# Patient Record
Sex: Female | Born: 1980 | Race: White | Hispanic: No | Marital: Married | Smoking: Never smoker
Health system: Southern US, Community
[De-identification: ages and names within clinical notes are randomized; demographics above are authoritative.]

## PROBLEM LIST (undated history)

## (undated) DIAGNOSIS — F419 Anxiety disorder, unspecified: Secondary | ICD-10-CM

## (undated) DIAGNOSIS — C4490 Unspecified malignant neoplasm of skin, unspecified: Secondary | ICD-10-CM

## (undated) DIAGNOSIS — C439 Malignant melanoma of skin, unspecified: Secondary | ICD-10-CM

## (undated) HISTORY — PX: COSMETIC SURGERY: SHX468

## (undated) HISTORY — PX: BREAST ENHANCEMENT SURGERY: SHX7

## (undated) HISTORY — DX: Unspecified malignant neoplasm of skin, unspecified: C44.90

## (undated) HISTORY — DX: Anxiety disorder, unspecified: F41.9

## (undated) HISTORY — DX: Malignant melanoma of skin, unspecified: C43.9

---
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
## 2001-10-14 ENCOUNTER — Other Ambulatory Visit: Admission: RE | Admit: 2001-10-14 | Discharge: 2001-10-14 | Payer: Self-pay | Admitting: Family Medicine

## 2002-12-03 ENCOUNTER — Other Ambulatory Visit: Admission: RE | Admit: 2002-12-03 | Discharge: 2002-12-03 | Payer: Self-pay | Admitting: Family Medicine

## 2003-11-27 ENCOUNTER — Other Ambulatory Visit: Admission: RE | Admit: 2003-11-27 | Discharge: 2003-11-27 | Payer: Self-pay | Admitting: Family Medicine

## 2004-12-28 ENCOUNTER — Observation Stay (HOSPITAL_COMMUNITY): Admission: AD | Admit: 2004-12-28 | Discharge: 2004-12-28 | Payer: Self-pay | Admitting: Obstetrics & Gynecology

## 2004-12-30 ENCOUNTER — Inpatient Hospital Stay (HOSPITAL_COMMUNITY): Admission: AD | Admit: 2004-12-30 | Discharge: 2005-01-02 | Payer: Self-pay | Admitting: Obstetrics and Gynecology

## 2005-02-20 ENCOUNTER — Other Ambulatory Visit: Admission: RE | Admit: 2005-02-20 | Discharge: 2005-02-20 | Payer: Self-pay | Admitting: Obstetrics and Gynecology

## 2005-12-08 ENCOUNTER — Encounter: Payer: Self-pay | Admitting: Family Medicine

## 2005-12-08 ENCOUNTER — Other Ambulatory Visit: Admission: RE | Admit: 2005-12-08 | Discharge: 2005-12-08 | Payer: Self-pay | Admitting: Family Medicine

## 2005-12-08 ENCOUNTER — Ambulatory Visit: Payer: Self-pay | Admitting: Family Medicine

## 2005-12-08 LAB — CONVERTED CEMR LAB: Pap Smear: NORMAL

## 2007-01-22 ENCOUNTER — Telehealth (INDEPENDENT_AMBULATORY_CARE_PROVIDER_SITE_OTHER): Payer: Self-pay | Admitting: *Deleted

## 2007-02-21 ENCOUNTER — Telehealth (INDEPENDENT_AMBULATORY_CARE_PROVIDER_SITE_OTHER): Payer: Self-pay | Admitting: *Deleted

## 2007-02-27 ENCOUNTER — Encounter: Payer: Self-pay | Admitting: Family Medicine

## 2007-02-27 DIAGNOSIS — N946 Dysmenorrhea, unspecified: Secondary | ICD-10-CM

## 2007-03-22 ENCOUNTER — Encounter: Payer: Self-pay | Admitting: Family Medicine

## 2007-03-22 ENCOUNTER — Other Ambulatory Visit: Admission: RE | Admit: 2007-03-22 | Discharge: 2007-03-22 | Payer: Self-pay | Admitting: Family Medicine

## 2007-03-22 ENCOUNTER — Ambulatory Visit: Payer: Self-pay | Admitting: Family Medicine

## 2007-03-27 ENCOUNTER — Encounter (INDEPENDENT_AMBULATORY_CARE_PROVIDER_SITE_OTHER): Payer: Self-pay | Admitting: *Deleted

## 2007-03-27 LAB — CONVERTED CEMR LAB: Pap Smear: NORMAL

## 2007-04-04 ENCOUNTER — Telehealth (INDEPENDENT_AMBULATORY_CARE_PROVIDER_SITE_OTHER): Payer: Self-pay | Admitting: *Deleted

## 2007-04-17 ENCOUNTER — Telehealth (INDEPENDENT_AMBULATORY_CARE_PROVIDER_SITE_OTHER): Payer: Self-pay | Admitting: *Deleted

## 2007-08-01 DIAGNOSIS — C439 Malignant melanoma of skin, unspecified: Secondary | ICD-10-CM

## 2007-08-01 HISTORY — DX: Malignant melanoma of skin, unspecified: C43.9

## 2007-08-02 ENCOUNTER — Ambulatory Visit: Payer: Self-pay | Admitting: Family Medicine

## 2007-08-02 LAB — CONVERTED CEMR LAB
Beta hcg, urine, semiquantitative: NEGATIVE
Glucose, Urine, Semiquant: NEGATIVE
Ketones, urine, test strip: NEGATIVE
Urine crystals, microscopic: 0 /hpf
Urobilinogen, UA: 0.2
WBC Urine, dipstick: NEGATIVE
pH: 7

## 2007-08-03 ENCOUNTER — Encounter: Payer: Self-pay | Admitting: Family Medicine

## 2007-08-05 LAB — CONVERTED CEMR LAB
Basophils Relative: 0.1 % (ref 0.0–1.0)
Eosinophils Relative: 2.6 % (ref 0.0–5.0)
Lymphocytes Relative: 28.8 % (ref 12.0–46.0)
Neutro Abs: 4.7 10*3/uL (ref 1.4–7.7)
Platelets: 325 10*3/uL (ref 150–400)
WBC: 7.9 10*3/uL (ref 4.5–10.5)

## 2007-11-12 ENCOUNTER — Ambulatory Visit: Payer: Self-pay | Admitting: Family Medicine

## 2007-11-12 LAB — CONVERTED CEMR LAB: Rapid Strep: NEGATIVE

## 2008-02-13 ENCOUNTER — Encounter: Payer: Self-pay | Admitting: Family Medicine

## 2008-02-19 ENCOUNTER — Encounter: Payer: Self-pay | Admitting: Family Medicine

## 2008-02-28 ENCOUNTER — Encounter: Payer: Self-pay | Admitting: Family Medicine

## 2008-03-23 ENCOUNTER — Encounter: Payer: Self-pay | Admitting: Family Medicine

## 2008-04-02 ENCOUNTER — Inpatient Hospital Stay (HOSPITAL_COMMUNITY): Admission: AD | Admit: 2008-04-02 | Discharge: 2008-04-02 | Payer: Self-pay | Admitting: Obstetrics and Gynecology

## 2008-04-06 ENCOUNTER — Inpatient Hospital Stay (HOSPITAL_COMMUNITY): Admission: AD | Admit: 2008-04-06 | Discharge: 2008-04-06 | Payer: Self-pay | Admitting: Obstetrics and Gynecology

## 2008-04-08 ENCOUNTER — Inpatient Hospital Stay (HOSPITAL_COMMUNITY): Admission: AD | Admit: 2008-04-08 | Discharge: 2008-04-09 | Payer: Self-pay | Admitting: Obstetrics and Gynecology

## 2008-04-08 ENCOUNTER — Encounter (INDEPENDENT_AMBULATORY_CARE_PROVIDER_SITE_OTHER): Payer: Self-pay | Admitting: Obstetrics and Gynecology

## 2008-06-01 ENCOUNTER — Telehealth (INDEPENDENT_AMBULATORY_CARE_PROVIDER_SITE_OTHER): Payer: Self-pay | Admitting: *Deleted

## 2008-06-02 ENCOUNTER — Ambulatory Visit: Payer: Self-pay | Admitting: Family Medicine

## 2008-06-15 DIAGNOSIS — D239 Other benign neoplasm of skin, unspecified: Secondary | ICD-10-CM

## 2008-06-15 HISTORY — DX: Other benign neoplasm of skin, unspecified: D23.9

## 2008-08-28 ENCOUNTER — Encounter: Payer: Self-pay | Admitting: Family Medicine

## 2009-02-19 ENCOUNTER — Encounter: Payer: Self-pay | Admitting: Family Medicine

## 2009-07-19 ENCOUNTER — Telehealth: Payer: Self-pay | Admitting: Family Medicine

## 2009-08-27 ENCOUNTER — Encounter: Payer: Self-pay | Admitting: Family Medicine

## 2010-01-20 ENCOUNTER — Ambulatory Visit: Payer: Self-pay | Admitting: Family Medicine

## 2010-01-21 LAB — CONVERTED CEMR LAB
AST: 18 units/L (ref 0–37)
Alkaline Phosphatase: 40 units/L (ref 39–117)
BUN: 11 mg/dL (ref 6–23)
Basophils Absolute: 0 10*3/uL (ref 0.0–0.1)
Calcium: 9.2 mg/dL (ref 8.4–10.5)
Cholesterol: 187 mg/dL (ref 0–200)
Creatinine, Ser: 0.7 mg/dL (ref 0.4–1.2)
GFR calc non Af Amer: 109.28 mL/min (ref >60)
Glucose, Bld: 76 mg/dL (ref 70–99)
HDL: 42.5 mg/dL (ref >39.00)
Lymphocytes Relative: 28.3 % (ref 12.0–46.0)
Monocytes Relative: 7.6 % (ref 3.0–12.0)
Neutrophils Relative %: 59.5 % (ref 43.0–77.0)
Platelets: 252 10*3/uL (ref 150.0–400.0)
RDW: 13.2 % (ref 11.5–14.6)
Sodium: 140 meq/L (ref 135–145)
Total Bilirubin: 0.5 mg/dL (ref 0.3–1.2)
VLDL: 27 mg/dL (ref 0.0–40.0)

## 2010-01-24 ENCOUNTER — Other Ambulatory Visit: Admission: RE | Admit: 2010-01-24 | Discharge: 2010-01-24 | Payer: Self-pay | Admitting: Family Medicine

## 2010-01-24 ENCOUNTER — Ambulatory Visit: Payer: Self-pay | Admitting: Family Medicine

## 2010-02-25 ENCOUNTER — Encounter: Payer: Self-pay | Admitting: Family Medicine

## 2010-04-27 ENCOUNTER — Ambulatory Visit: Payer: Self-pay | Admitting: Family Medicine

## 2010-04-27 DIAGNOSIS — G479 Sleep disorder, unspecified: Secondary | ICD-10-CM | POA: Insufficient documentation

## 2010-07-26 ENCOUNTER — Ambulatory Visit
Admission: RE | Admit: 2010-07-26 | Discharge: 2010-07-26 | Payer: Self-pay | Source: Home / Self Care | Attending: Family Medicine | Admitting: Family Medicine

## 2010-08-09 ENCOUNTER — Ambulatory Visit
Admission: RE | Admit: 2010-08-09 | Discharge: 2010-08-09 | Payer: Self-pay | Source: Home / Self Care | Attending: Family Medicine | Admitting: Family Medicine

## 2010-08-09 ENCOUNTER — Encounter: Payer: Self-pay | Admitting: Family Medicine

## 2010-09-01 NOTE — Letter (Signed)
Summary: [facility] Surgical Oncology & Endocrine  [facility] Surgical Oncology & Endocrine   Imported By: Sherian Rein 04/07/2010 10:00:07  _____________________________________________________________________  External Attachment:    Type:   Image     Comment:   External Document

## 2010-09-01 NOTE — Letter (Signed)
Summary: [facility] Surgical Oncology & Endocrine Surgery  [facility] Endocrine Surgery   Imported By: Lanelle Bal 10/11/2009 11:18:24  _____________________________________________________________________  External Attachment:    Type:   Image     Comment:   External Document

## 2010-09-01 NOTE — Assessment & Plan Note (Signed)
Summary: COUGH/COLD/DLO   Vital Signs:  Patient profile:   30 year old female Height:      64 inches Weight:      134.25 pounds BMI:     23.13 Temp:     98.1 degrees F oral Pulse rate:   80 / minute Pulse rhythm:   regular BP sitting:   106 / 72  (left arm) Cuff size:   regular  Vitals Entered By: Lewanda Rife LPN (July 26, 2010 9:03 AM) CC: cold, productive cugh with green mucus, h/a   History of Present Illness: 2 weeks of uri and not getting better  took robitussin cold and sinus- not helping  coughing stuff up that is green  some days a little better - worse at night  a little facial pressure  nose is congested  ears do not hurt throat sore from coughing   son has a raspy cough   Allergies: 1)  ! 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 Eyes:  Denies blurring, discharge, and eye irritation. CV:  Denies chest pain or discomfort, palpitations, and shortness of breath with exertion. Resp:  Complains of cough and sputum productive; denies pleuritic, shortness of breath, and wheezing. GI:  Denies nausea and vomiting. Derm:  Denies rash.  Physical Exam  General:  Well-developed,well-nourished,in no acute distress; alert,appropriate and cooperative throughout examination Head:  normocephalic, atraumatic, and no abnormalities observed.  no sinus tenderness  Eyes:  vision grossly intact, pupils equal, pupils round, and pupils reactive to light.   Ears:  R ear normal and L ear normal.   Nose:  nares are  injected and congested bilaterally  Mouth:  pharynx pink and moist.   mild post injection  Neck:  No deformities, masses, or tenderness noted. Chest Wall:  No deformities, masses, or tenderness noted. Lungs:  CTA with generally harsh bs and occ rhonchi no rales no wheeze or sob Heart:  Normal rate and regular rhythm. S1 and S2 normal without gallop, murmur, click, rub or other extra sounds. Skin:  Intact without suspicious lesions or rashes Cervical Nodes:  No lymphadenopathy noted Psych:  normal affect, talkative and pleasant    Impression & Recommendations:  Problem # 1:  BRONCHITIS, ACUTE (ICD-466.0) Assessment New  due to persistant purulent sputum and 2 wk duration will tx with abx- zpak thankfully no reactive airways so far recommend sympt care- see pt instructions   pt advised to update me if symptoms worsen or do not improve  Her updated medication list for this problem includes:    Zithromax Z-pak 250 Mg Tabs (Azithromycin) .Marland Kitchen... Take by mouth as directed  Orders: Prescription Created Electronically [phone])  Complete Medication List: 1)  Iud  .... Placed 11/2008 2)  Ibuprofen 200 Mg Tabs (Ibuprofen) .... Otc as directed. 3)  Zithromax Z-pak 250 Mg Tabs (Azithromycin) .... Take by mouth as directed  Patient Instructions: 1)  take zithromax as directed  2)  you can try mucinex over the counter twice daily as directed and nasal saline spray for congestion 3)  tylenol over the counter as directed may  help with aches, headache and fever 4)  call if symptoms worsen or if not improved in 4-5 days  Prescriptions: ZITHROMAX Z-PAK 250 MG TABS (AZITHROMYCIN) take by mouth as directed  #1 pack x 0   Entered and Authorized by:   Judith Part MD   Signed by:   Judith Part MD on 07/26/2010   Method used:   Electronically to        [id number] (retail)       [address]       [place], [place]  [address]       Ph: [phone]       Fax: [phone]    RxID:   [id number]    Orders Added: 1)  Prescription Created Electronically [[id number]] 2)  Est. Patient Level III [24401]    Current Allergies (reviewed today): ! PENICILLIN

## 2010-09-01 NOTE — Assessment & Plan Note (Signed)
Summary: CPX/DLO   Vital Signs:  Patient profile:   30 year old female Height:      64 inches Weight:      126 pounds BMI:     21.71 Temp:     98.6 degrees F oral Pulse rate:   80 / minute Pulse rhythm:   regular BP sitting:   112 / 72  (left arm) Cuff size:   regular  Vitals Entered By: Lewanda Rife LPN (January 24, 2010 9:39 AM) CC: CPX with pap and breast exam LMP 12/06/09 (Has IUD and does not have monthly periods)   History of Present Illness: here for health mt and gyn exam   is doing ok overall - physically  her separation has taken a big toll on her  has not seen a counselor -- good support in friends and family  has a lot of help  divorce is upcoming in aug   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 Eyes:  Denies blurring and eye  irritation. CV:  Denies chest pain or discomfort, lightheadness, palpitations, and shortness of breath with exertion. Resp:  Denies cough, shortness of breath, and wheezing. GI:  Denies abdominal pain, change in bowel habits, indigestion, and nausea. GU:  Denies discharge, dysuria, and urinary frequency. MS:  Denies joint pain, joint swelling, and cramps. Derm:  Denies itching, lesion(s), poor wound healing, and rash. Neuro:  Denies numbness and tingling. Psych:  mood is fair - stress of divorce. Endo:  Denies cold intolerance, excessive thirst, excessive urination, and heat intolerance. Heme:  Denies abnormal bruising and bleeding.  Physical Exam  General:  Well-developed,well-nourished,in no acute distress; alert,appropriate and cooperative throughout examination Head:  normocephalic, atraumatic, and no abnormalities observed.   Eyes:  vision grossly intact, pupils equal, pupils round, and pupils reactive to light.   Ears:  R ear normal and L ear normal.   Nose:  no nasal discharge.   Mouth:  pharynx pink and moist.   Neck:  supple with full rom and no masses or thyromegally, no JVD or carotid bruit  Chest Wall:  No deformities, masses, or tenderness noted. Breasts:  No mass, nodules, thickening, tenderness, bulging, retraction,  inflamation, nipple discharge or skin changes noted.   breast implants noted  Lungs:  Normal respiratory effort, chest expands symmetrically. Lungs are clear to auscultation, no crackles or wheezes. Heart:  Normal rate and regular rhythm. S1 and S2 normal without gallop, murmur, click, rub or other extra sounds. Abdomen:  Bowel sounds positive,abdomen soft and non-tender without masses, organomegaly or hernias noted. Genitalia:  Normal introitus for age, no external lesions, no vaginal discharge, mucosa pink and moist, no vaginal or cervical lesions, no vaginal atrophy, no friaility or hemorrhage, normal uterus size and position, no adnexal masses or  tenderness iud string noted at os Msk:  No deformity or scoliosis noted of thoracic or lumbar spine.  no acute joint changes  Pulses:  R and L carotid,radial,femoral,dorsalis pedis and posterior tibial pulses are full and equal bilaterally Extremities:  No clubbing, cyanosis, edema, or deformity noted with normal full range of motion of all joints.   Neurologic:  sensation intact to light touch, gait normal, and DTRs symmetrical and normal.   Skin:  Intact without suspicious lesions or rashes Cervical Nodes:  No lymphadenopathy noted Axillary Nodes:  No palpable lymphadenopathy Inguinal Nodes:  No significant adenopathy Psych:  normal affect, talkative and pleasant    Impression & Recommendations:  Problem # 1:  HEALTH MAINTENANCE EXAM (ICD-V70.0) Assessment Comment Only reviewed health habits including diet, exercise and skin cancer prevention reviewed health maintenance list and family history  urged to keep up with regular derm visits and use her sun protection reviewed wellness labs in detail incl good chol profile  Problem # 2:  GYNECOLOGICAL EXAMINATION, ROUTINE (ICD-V72.31) Assessment: Comment Only annual exam with pap  doing well with iud- no menses  reminded to use condoms for std prev -- screen today  Problem # 3:  DYSMENORRHEA (ICD-625.3) Assessment: Improved resolved with IUD- happy with that   Problem # 4:  SCREENING EXAMINATION FOR VENEREAL DISEASE (ICD-V74.5) Assessment: New std screen - gon / chlam screen today at pt request   Complete Medication List: 1)  Iud  .... Placed 11/2008 2)  Ibuprofen 200 Mg Tabs (Ibuprofen) .... Otc as directed.  Other Orders: TD Toxoids IM 7 YR + (16109) Admin 1st Vaccine (60454)  Patient Instructions: 1)  tetnus shot today  2)  will update you with pap and test results when they return 3)  keep up the good health habits   Current Allergies (reviewed today): ! PENICILLIN    Immunizations Administered:  Tetanus  Vaccine:    Vaccine Type: Td    Site: right deltoid    Mfr: Sanofi Pasteur    Dose: 0.5 ml    Route: IM    Given by: Lewanda Rife LPN    Exp. Date: 08/26/2011    Lot #: [id number]    VIS given: 06/18/07 version given January 24, 2010.

## 2010-09-01 NOTE — Assessment & Plan Note (Signed)
Summary: PROBLEMS SLEEPING   Vital Signs:  Patient profile:   30 year old female Height:      64 inches Weight:      127.75 pounds BMI:     22.01 Temp:     98.4 degrees F oral Pulse rate:   80 / minute Pulse rhythm:   regular BP sitting:   110 / 74  (left arm) Cuff size:   regular  Vitals Entered By: Lewanda Rife LPN (April 27, 2010 8:53 AM) CC: problems sleeping due to numbness in different areas of body   History of Present Illness: trouble sleeping for the past few months  wakes up because she gets numb in different parts of body hands -- if she sleeps in certain position  sometimes feet -- if she sits on her legs  has trouble when she carries a heavy bag   thinks it is anxiety related  is going through divorce  ex husb wants some custody job- budget time very stressful   has seen a counselor a few times -- has been helpful  sees Toribio Harbour   is feeling anxious mainly at night - and restless   has not tried otc meds     Allergies: 1)  ! 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 Eyes:  Denies blurring and eye irritation. CV:  Denies lightheadness and palpitations. Resp:  Denies cough, shortness of breath, and wheezing. GI:  Denies abdominal pain, change in bowel habits, and indigestion. GU:  Denies dysuria and urinary frequency. MS:  Denies joint pain and stiffness. Derm:  Denies itching, lesion(s), poor wound healing, and rash. Neuro:  Complains of numbness and tingling; denies visual  disturbances. Psych:  Complains of anxiety; denies panic attacks, sense of great danger, and suicidal thoughts/plans. Endo:  Denies cold intolerance, excessive thirst, excessive urination, and heat intolerance. Heme:  Denies abnormal bruising and bleeding.  Physical Exam  General:  Well-developed,well-nourished,in no acute distress; alert,appropriate and cooperative throughout examination Head:  normocephalic, atraumatic, and no abnormalities observed.   Eyes:  vision grossly intact, pupils equal, pupils round, and pupils reactive to light.  no nystagmus Mouth:  pharynx pink and moist.   Neck:  No deformities, masses, or tenderness noted. Lungs:  Normal respiratory effort, chest expands symmetrically. Lungs are clear to auscultation, no crackles or wheezes. Heart:  Normal rate and regular rhythm. S1 and S2 normal without gallop, murmur, click, rub or other extra sounds. Abdomen:  Bowel sounds positive,abdomen soft and non-tender without masses, organomegaly or hernias noted. Msk:  No deformity or scoliosis noted of thoracic or lumbar spine.  no acute joint changes  Extremities:  No clubbing, cyanosis, edema, or deformity noted with normal full range of motion of all joints.   Neurologic:  alert & oriented X3, cranial nerves II-XII intact, strength normal in all extremities, sensation intact to light touch, gait normal, DTRs symmetrical and normal, toes down bilaterally on Babinski, and Romberg negative.   Skin:  Intact without suspicious lesions or rashes Cervical Nodes:  No lymphadenopathy noted Psych:  seems stressed but overall chipper  with good eye contact  not tearful   Impression & Recommendations:  Problem # 1:  SLEEP DISORDER (ICD-780.50) Assessment New likley 2nd to anx from recent stressors (disc in detail)  rev sleep hygiene and handout given from [facility]  trial of benadryl or melatonin or volarian if not helpful disc px for anx or sleep   Problem # 2:  STRESS REACTION,  ACUTE, WITH EMOTIONAL DISTURBANCE (ICD-308.0) Assessment: New suspect this is adding to intermittent parethesias and worry disc stressors/ coping tech/ symptoms and opt for tx in detail  adv to continue counseling will updtae me if she feels she needs counseling  Complete Medication List: 1)  Iud  .... Placed 11/2008 2)  Ibuprofen 200 Mg Tabs (Ibuprofen) .... Otc as directed.  Patient Instructions: 1)  for sleep I recommend - bedtime and wake time the same every day  2)  no caffiene after 9-10 am  3)  no alcohol at night  4)  continue exercise at lunch time  5)  don't do anything in bed but sleep  6)  cool room / very dark room  7)  try first 25-50 mg of benadryl about 1 hour before bed  8)  if that does not work- try melatonin or volarian (both herbs )- as directed 9)  if these don't work - let me know  10)  if you feel you need something for anxiety- call  11)  continue counseling   Current Allergies (reviewed today): ! PENICILLIN

## 2010-09-01 NOTE — Assessment & Plan Note (Signed)
Summary: CPX/JRR   Vital Signs:  Patient profile:   30 year old female Height:      64 inches Weight:      136.75 pounds BMI:     23.56 Temp:     98 degrees F oral Pulse rate:   76 / minute Pulse rhythm:   regular BP sitting:   100 / 64  (left arm) Cuff size:   regular  Vitals Entered By: Lewanda Rife LPN (August 09, 2010 9:49 AM) CC: wants pap smear LMP no period with IUD   History of Present Illness: here for pap for exposure to hpv   her husband and she got back together  he and she have had other partners  he had a lesion -- was hpv - the bad subtype-- had it removed  also wants gon / chlamydia test as well   wants to be screened for all stds   is doing well overall  he is treating her very well    no d/c or pain or problems there is one spot to look at that is new   no bleeding on the iud     had pap in june normal   Td 6/11  wt is up 2 lb   good bp 100/64  hx of melanoma - having regular follow ups and doing well with that   Allergies: 1)  ! 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 Eyes:  Denies blurring and eye irritation. CV:  Denies chest pain or discomfort, lightheadness, palpitations, and shortness of breath with exertion. Resp:  Denies cough and wheezing. GI:  Denies abdominal pain, indigestion, and nausea. GU:  Denies abnormal vaginal bleeding, discharge, and  dysuria. MS:  Denies cramps and stiffness. Derm:  Denies itching, lesion(s), poor wound healing, and rash. Neuro:  Denies headaches, numbness, and tingling. Psych:  mood is good . Endo:  Denies cold intolerance, excessive urination, and heat intolerance. Heme:  Denies abnormal bruising and bleeding.  Physical Exam  General:  Well-developed,well-nourished,in no acute distress; alert,appropriate and cooperative throughout examination Head:  normocephalic, atraumatic, and no abnormalities observed.   Eyes:  vision grossly intact, pupils equal, pupils round, and pupils reactive to light.  no conjunctival pallor, injection or icterus  Mouth:  pharynx pink and moist.   Neck:  supple with full rom and no masses or thyromegally, no JVD or carotid bruit  Chest Wall:  No deformities, masses, or tenderness noted. Lungs:  CTA with generally harsh bs and occ rhonchi no rales no wheeze or sob Heart:  Normal rate and regular rhythm. S1 and S2 normal without gallop, murmur, click, rub or other extra sounds. Abdomen:  Bowel sounds positive,abdomen soft and non-tender without masses, organomegaly or hernias noted. no suprapubic tenderness or fullness felt  Genitalia:  Normal introitus for age, no external lesions, no vaginal discharge, mucosa pink and moist, no vaginal or cervical lesions, no vaginal atrophy, no friaility or hemorrhage, normal uterus size and position, no  adnexal masses or tenderness  lesion in question was skin tag on L labia majora Msk:  No deformity or scoliosis noted of thoracic or lumbar spine.   Pulses:  R and L carotid,radial,femoral,dorsalis pedis and posterior tibial pulses are full and equal bilaterally Extremities:  No clubbing, cyanosis, edema, or deformity noted with normal full range of motion of all joints.   Neurologic:  alert & oriented X3, cranial nerves II-XII intact, strength normal in all extremities, sensation intact to light touch, gait normal, DTRs symmetrical and  normal, toes down bilaterally on Babinski, and Romberg negative.   Skin:  Intact without suspicious lesions or rashes Cervical Nodes:  No lymphadenopathy noted Psych:  normal affect, talkative and pleasant    Impression & Recommendations:  Problem # 1:  SPECIAL SCREENING EXAMINATION HUMAN PAPILVIRUS (ICD-V73.81) Assessment New  pt was exp to high risk hpv  will do pap and hpv test today no lesions or symptoms will monitor closely   Orders: Specimen Handling (91478) T-HIV Antibody  (Reflex) ([id number]) T-Syphilis Test (RPR) ([id number]) Pap Smear, Thin Prep ( Collection of) ([id number])  Problem # 2:  SCREENING EXAMINATION FOR VENEREAL DISEASE (ICD-V74.5) Assessment: New  will do gon / chlam/ and hiv and rpr today as well as hpv poss exp no symptoms   Orders: Specimen Handling (24401) T-HIV Antibody  (Reflex) ([id number]) T-Syphilis Test (RPR) ([id number]) Pap Smear, Thin Prep ( Collection of) ([id number])  Problem # 3:  SEXUALLY TRANSMITTED DISEASE, EXPOSURE TO (ICD-V01.6) Assessment: New  see above screening test today  Orders: Specimen Handling (43329) T-HIV Antibody  (Reflex) ([id number]) T-Syphilis Test (RPR) [phone]) Pap Smear, Thin Prep ( Collection of) (T5573)  Complete Medication List: 1)  Iud  .... Placed 11/2008 2)  Ibuprofen 200 Mg Tabs (Ibuprofen) .... Otc as directed.  Patient Instructions: 1)  please draw labs for [employer] HIV and RPR -- for V74.5 and also V01.6 thanks 2)  pap done today- included hpv test and gon/ chlamydia    Orders Added: 1)  Specimen Handling [99000] 2)  T-HIV Antibody  (Reflex) [[id number]] 3)  T-Syphilis Test (RPR) [[id number]] 4)  Est. Patient Level III [76160] 5)  Pap Smear, Thin Prep ( Collection of) [Q0091]    Current Allergies (reviewed today): ! PENICILLIN

## 2010-12-16 NOTE — Discharge Summary (Signed)
NAME:  Caitlin Ward, Caitlin Ward NO.:  [id number]   MEDICAL RECORD NO.:  [id number]          PATIENT TYPE:  INP   LOCATION:  9160                          FACILITY:  [facility]   PHYSICIAN:  Juluis Mire, M.D.   DATE OF BIRTH:  [birth year]   DATE OF ADMISSION:  12/28/2004  DATE OF DISCHARGE:  12/28/2004                                 DISCHARGE SUMMARY   ADMISSION DIAGNOSIS:  Intrauterine pregnancy at 37 weeks, possible labor.   DISCHARGE DIAGNOSIS:  False labor.   For complete history and physical, please see written note.   COURSE IN THE HOSPITAL:  The patient was brought in per Dr. Jennette Kettle for  possible early labor.  She was observed throughout the morning with no  cervical change and relatively irregular uterine activity.  Cervix remained  2 cm.  Fifty percent effaced.  The vertex was presenting.  Membranes were  intact.  Fetal heart rate was reactive with fast decelerations.  She had  evidently had a history of proteinuria in the office.  We went ahead and  watched her blood pressures, all of which were in normal range.  We went  ahead and did PIH panels, all of which were within normal limits.  In view  of the lack of progression we discussed options.  Since she is only 37-1/2  weeks, the decision was to go ahead and discharge her home to await further  progression of labor.  We discussed increasing uterine activity, any leaking  or bleeding should be reported.  Also, fetal movement should stay  consistent.  She will be followed in the office on Friday.      JSM/MEDQ  D:  12/28/2004  T:  12/28/2004  Job:  [id number]

## 2011-03-27 ENCOUNTER — Encounter: Payer: Self-pay | Admitting: Family Medicine

## 2011-03-28 ENCOUNTER — Ambulatory Visit (INDEPENDENT_AMBULATORY_CARE_PROVIDER_SITE_OTHER): Payer: 59 | Admitting: Family Medicine

## 2011-03-28 ENCOUNTER — Encounter: Payer: Self-pay | Admitting: Family Medicine

## 2011-03-28 VITALS — BP 106/72 | HR 76 | Temp 98.5°F | Ht 64.0 in | Wt 121.5 lb

## 2011-03-28 DIAGNOSIS — F419 Anxiety disorder, unspecified: Secondary | ICD-10-CM

## 2011-03-28 DIAGNOSIS — F32A Depression, unspecified: Secondary | ICD-10-CM

## 2011-03-28 DIAGNOSIS — F329 Major depressive disorder, single episode, unspecified: Secondary | ICD-10-CM | POA: Insufficient documentation

## 2011-03-28 DIAGNOSIS — F341 Dysthymic disorder: Secondary | ICD-10-CM

## 2011-03-28 MED ORDER — PAROXETINE HCL 10 MG PO TABS: 20.0000 mg | ORAL_TABLET | Freq: Every evening | ORAL | Status: DC

## 2011-03-28 NOTE — Progress Notes (Signed)
Subjective:    Patient ID: Caitlin Ward, female    DOB: April 17, 1981, 30 y.o.   MRN: [id number]  HPI Here for stress induced weight loss issues  When she gets stressed- gets nauseated when eating and has to stop after a few bites  No throwing up  Not trying to loose weight -- and no body image issues at all   Does have a counselor that she sees every week - sometimes helpful - not all the time   Anxiety comes back a day later -- really up and down like a roller coster  No abuse in the picture  Overall things are looking better  Is not in counseling with her husband yet   In her heart - she thinks things will work out  There is fear from both sides - of the unknown  Has 2 kids- they do pick up on it   Spending time with her family gives her joy  Now work no longer gives her joy   Anxious all the time -- feels palpitaions or short of breath until the situation changes Takes ibuprofen every day for headaches -- every day  Also occ gets some vertigo at night time Also not ? Enough fluids   Is sleeping ok - so that is good (takes a while to go to sleep)   Patient Active Problem List  Diagnoses  . DYSMENORRHEA  . SLEEP DISORDER  . Anxiety and depression   Past Medical History  Diagnosis Date  . Malignant melanoma     left arm- wide excision   Past Surgical History  Procedure Date  . Breast enhancement surgery    History  Substance Use Topics  . Smoking status: Never Smoker   . Smokeless tobacco: Not on file  . Alcohol Use: Not on file   Family History  Problem Relation Age of Onset  . Coronary artery disease Maternal Grandfather   . Hypertension Paternal Grandfather    Allergies  Allergen Reactions  . Penicillins     REACTION: As a small child does not remember exact reaction.   Current Outpatient Prescriptions on File Prior to Visit  Medication Sig Dispense Refill  . ibuprofen (ADVIL,MOTRIN) 200 MG tablet Take otc as directed       . IUD'S IU by  Intrauterine route.             Review of Systems Review of Systems  Constitutional: Negative for fever, appetite change, fatigue and unexpected weight change.  Eyes: Negative for pain and visual disturbance.  Respiratory: Negative for cough and shortness of breath.   Cardiovascular: Negative.  for cp and palpitations  Gastrointestinal: Negative for nausea, diarrhea and constipation.  Genitourinary: Negative for urgency and frequency.  Skin: Negative for pallor. no rash  Neurological: Negative for weakness, light-headedness, numbness and headaches.  Hematological: Negative for adenopathy. Does not bruise/bleed easily.  Psychiatric/Behavioral: pos for depressive and anxious symptoms , no SI          Objective:   Physical Exam  Constitutional: She appears well-developed and well-nourished. No distress.       Slim and fatigued appearing   HENT:  Head: Normocephalic and atraumatic.  Eyes: Conjunctivae and EOM are normal. Pupils are equal, round, and reactive to light. No scleral icterus.  Neck: Normal range of motion. Neck supple. No JVD present. No thyromegaly present.  Cardiovascular: Normal rate, regular rhythm, normal heart sounds and intact distal pulses.   No murmur heard. Pulmonary/Chest: Effort normal and  breath sounds normal. No respiratory distress. She has no wheezes.  Abdominal: Soft. Bowel sounds are normal. She exhibits no distension and no mass. There is no tenderness.  Musculoskeletal: She exhibits no edema.  Lymphadenopathy:    She has no cervical adenopathy.  Neurological: She is alert. She has normal reflexes. She exhibits normal muscle tone. Coordination normal.       No tremor   Skin: Skin is warm and dry. No rash noted. No erythema. No pallor.  Psychiatric:       Generally anxious  Tearful at times Good communication skills and talks freely of stressors  Fair eye contact           Assessment & Plan:

## 2011-03-28 NOTE — Patient Instructions (Signed)
Continue counseling  Start paxil 10 mg one pill daily in the evening - for 2 weeks and then if well tolerated increase to 2 pills once daily in evening  If depression or anxiety worsens - stop the drug and update me  If weight loss continues also update me  Follow up with me in 3-4 weeks

## 2011-03-28 NOTE — Assessment & Plan Note (Signed)
Related to stress from marital problems- causing GI symptoms and weight loss  Disc coping skills Will continue counseling  Start paxil 10 mg for 2 wk then inc to 20mg  as tolerated Disc poss side eff- if worse orSI will call and stop med F/u 3-4 wk  Will do labs if further wt loss >25 min spent with face to face with patient, >50% counseling and/or coordinating care

## 2011-05-01 ENCOUNTER — Encounter: Payer: Self-pay | Admitting: Family Medicine

## 2011-05-01 ENCOUNTER — Ambulatory Visit: Payer: 59 | Admitting: Family Medicine

## 2011-05-01 ENCOUNTER — Ambulatory Visit (INDEPENDENT_AMBULATORY_CARE_PROVIDER_SITE_OTHER): Payer: 59 | Admitting: Family Medicine

## 2011-05-01 VITALS — BP 100/68 | HR 76 | Temp 97.9°F | Ht 64.0 in | Wt 129.8 lb

## 2011-05-01 DIAGNOSIS — F341 Dysthymic disorder: Secondary | ICD-10-CM

## 2011-05-01 DIAGNOSIS — F419 Anxiety disorder, unspecified: Secondary | ICD-10-CM

## 2011-05-01 DIAGNOSIS — Z23 Encounter for immunization: Secondary | ICD-10-CM

## 2011-05-01 NOTE — Assessment & Plan Note (Signed)
Stress related and much imp with imp in stress  paxil was sedating but helped appetite much and gained 8 lb- feels much better  Off of it after 2 weeks Few small panic attacks and now feels back to normal  Will update me if symptoms return Overall good coping skills  Flu shot also today

## 2011-05-01 NOTE — Progress Notes (Signed)
  Subjective:    Patient ID: Caitlin Ward, female    DOB: 13-Oct-1980, 30 y.o.   MRN: [id number]  HPI Here to f/u for dep/anx/ stress related issues   paxil px last visit Took it for 2 weeks Unsure if it helps Sleepy all the time and was eating like a pig  Stress is better -- husband / kid's father came back Wt is up 8 lb   Had a lot of stress in work for a while  Several episodes of nervousness with rapid heart beat - now is much better  After this week work will get a lot better - budget is due   Wt is up 8 lb  Patient Active Problem List  Diagnoses  . DYSMENORRHEA  . SLEEP DISORDER  . Anxiety and depression   Past Medical History  Diagnosis Date  . Malignant melanoma     left arm- wide excision   Past Surgical History  Procedure Date  . Breast enhancement surgery    History  Substance Use Topics  . Smoking status: Never Smoker   . Smokeless tobacco: Not on file  . Alcohol Use: Not on file   Family History  Problem Relation Age of Onset  . Coronary artery disease Maternal Grandfather   . Hypertension Paternal Grandfather    Allergies  Allergen Reactions  . Penicillins     REACTION: As a small child does not remember exact reaction.   Current Outpatient Prescriptions on File Prior to Visit  Medication Sig Dispense Refill  . ibuprofen (ADVIL,MOTRIN) 200 MG tablet Take otc as directed       . IUD'S IU by Intrauterine route.            Review of Systems Review of Systems  Constitutional: Negative for fever, appetite change, fatigue and wt loss , pos for wt gain .  Eyes: Negative for pain and visual disturbance.  Respiratory: Negative for cough and shortness of breath.   Cardiovascular: Negative for cp or palpitations    Gastrointestinal: Negative for nausea, diarrhea and constipation.  Genitourinary: Negative for urgency and frequency.  Skin: Negative for pallor or rash   Neurological: Negative for weakness, light-headedness, numbness and headaches.   Hematological: Negative for adenopathy. Does not bruise/bleed easily.  Psychiatric/Behavioral: Negative for dysphoric mood. Anxiety is improved          Objective:   Physical Exam  Constitutional: She appears well-developed and well-nourished. No distress.  HENT:  Head: Normocephalic and atraumatic.  Mouth/Throat: Oropharynx is clear and moist.  Eyes: Conjunctivae and EOM are normal. Pupils are equal, round, and reactive to light.  Neck: Normal range of motion. Neck supple. No JVD present. Carotid bruit is not present. No thyromegaly present.  Cardiovascular: Normal rate, regular rhythm and normal heart sounds.   Pulmonary/Chest: Effort normal and breath sounds normal. No respiratory distress. She has no wheezes.  Lymphadenopathy:    She has no cervical adenopathy.  Neurological: She is alert. She has normal reflexes.       No tremor   Skin: Skin is warm and dry. No pallor.  Psychiatric: She has a normal mood and affect.       Much improved affect - is more relaxed and smiling           Assessment & Plan:

## 2011-05-01 NOTE — Patient Instructions (Signed)
I'm glad you are doing better and able to get off the paxil  Keep up the good diet and coping skills  Flu shot today -- good!  Let me know if you start to feel bad or loose weight again

## 2011-05-03 LAB — CBC
HCT: 38.8
Hemoglobin: 11 — ABNORMAL LOW
MCV: 90.4
Platelets: 288
Platelets: 295
RBC: 3.53 — ABNORMAL LOW
RBC: 4.29
RDW: 13.9
WBC: 12.3 — ABNORMAL HIGH
WBC: 9.1

## 2011-05-03 LAB — URIC ACID: Uric Acid, Serum: 4.6

## 2011-05-03 LAB — COMPREHENSIVE METABOLIC PANEL
BUN: 6
CO2: 24
Chloride: 106
Creatinine, Ser: 0.6
GFR calc non Af Amer: >60
Total Bilirubin: 0.6

## 2011-05-03 LAB — RPR: RPR Ser Ql: NONREACTIVE

## 2011-06-02 ENCOUNTER — Ambulatory Visit: Payer: 59

## 2011-06-08 ENCOUNTER — Ambulatory Visit: Payer: 59

## 2011-08-30 ENCOUNTER — Telehealth: Payer: Self-pay | Admitting: Family Medicine

## 2011-08-30 NOTE — Telephone Encounter (Signed)
Patient notified as instructed by telephone. Pt said she would contact GYN office for appt.

## 2011-08-30 NOTE — Telephone Encounter (Signed)
Since this may concern her IUD- I recommend she f/u with the GYN who put it in .Caitlin Ward Let me know if any problems  It does not look like she made appt here yet anyway from what I could see

## 2011-08-30 NOTE — Telephone Encounter (Signed)
Triage Record Num: [id number] Operator: Lyn Hollingshead Patient Name: Caitlin Ward Call Date & Time: 08/29/2011 5:17:59PM Patient Phone: [phone] PCP: Patient Gender: Female PCP Fax : Patient DOB: 1981/04/25 Practice Name: [facility] Reason for Call: Caller: [name]/Patient; PCP: Roxy Manns A.; CB#: [phone]; ; ; Call regarding Urinary Pain/Bleeding; Onset-08/19/2011 Pt has been spotting for 10 days. She has mild cramping that she rates at a 2 on pain scale. She has had IUD for 3years and has spotting sporadically but never this long. Emergent s/s of Contraception IUD r/o. Pt to see provider within 72hrs. Pt will call to schedule. Protocol(s) Used: Contraception: Intrauterine Device (IUD) Recommended Outcome per Protocol: See Provider within 72 Hours Reason for Outcome: Mild vaginal bleeding or spotting Care Advice: ~ SYMPTOM / CONDITION MANAGEMENT Refrain from douching, using feminine hygiene sprays, scented deodorant tampons, or nonprescription intravaginal medication until evaluated by a provider. ~ IUD Symptoms: Any of the following require an evaluation by provider. P - Period late (possible pregnancy), abnormal spotting or bleeding. A - Abnormal pain, pain with intercourse, pelvic pain or severe menstrual cramping. I - Infection exposure, abnormal vaginal discharge, partner diagnosed with sexually transmitted disease (STD). N - Not feeling well; chills, fever. S - String, missing or becoming shorter/longer. ~ 08/29/2011 5:40:23PM Page 1 of 1 [id number]

## 2011-09-06 ENCOUNTER — Telehealth: Payer: Self-pay | Admitting: Family Medicine

## 2011-09-06 NOTE — Telephone Encounter (Signed)
Patient said that Dr. Vance Gather office, Physician for [facility], needs pap results and proof faxed from our office that she has had paps for last several years in order to set up appt with them with current concerns.  Does not have their fax number.  Patient can be reached at [phone] or work [phone].

## 2011-09-07 NOTE — Telephone Encounter (Signed)
Faxed [phone] and 2012 pap reports to Dr Lower [phone] as instructed.Patient notified as instructed by telephone.

## 2011-09-08 ENCOUNTER — Encounter: Payer: Self-pay | Admitting: Family Medicine

## 2011-09-08 ENCOUNTER — Ambulatory Visit (INDEPENDENT_AMBULATORY_CARE_PROVIDER_SITE_OTHER): Payer: 59 | Admitting: Family Medicine

## 2011-09-08 VITALS — BP 90/64 | HR 72 | Temp 98.7°F | Ht 64.0 in | Wt 131.2 lb

## 2011-09-08 DIAGNOSIS — N390 Urinary tract infection, site not specified: Secondary | ICD-10-CM | POA: Insufficient documentation

## 2011-09-08 DIAGNOSIS — R350 Frequency of micturition: Secondary | ICD-10-CM

## 2011-09-08 LAB — POCT URINALYSIS DIPSTICK
Ketones, UA: NEGATIVE
Spec Grav, UA: 1.01

## 2011-09-08 MED ORDER — CIPROFLOXACIN HCL 250 MG PO TABS: 250.0000 mg | ORAL_TABLET | Freq: Two times a day (BID) | ORAL | Status: AC

## 2011-09-08 NOTE — Patient Instructions (Signed)
Take the  cipro as directed  Drink lots of water Update if not starting to improve in a week or if worsening   Follow up with gyn next week as planned I will update you when urine culture returns

## 2011-09-08 NOTE — Progress Notes (Signed)
  Subjective:    Patient ID: Caitlin Ward, female    DOB: 30-Oct-1980, 30 y.o.   MRN: [id number]  HPI Here for urinary symptoms  Has had a lot of cramping lately (had gyn appt on tue)  For past several days - has also had frequent urination-- urgency -- runs to get there and not much in bladder  Even just after she empties No burning  Not on period - due to IUD -- though last month did spot a lot  Not spotting now   No nausea or fever Is a bit dizzy for 2 days  Has IUD - not pregnant   Patient Active Problem List  Diagnoses  . DYSMENORRHEA  . SLEEP DISORDER  . Anxiety and depression  . UTI (lower urinary tract infection)   Past Medical History  Diagnosis Date  . Malignant melanoma     left arm- wide excision   Past Surgical History  Procedure Date  . Breast enhancement surgery    History  Substance Use Topics  . Smoking status: Never Smoker   . Smokeless tobacco: Not on file  . Alcohol Use: Not on file   Family History  Problem Relation Age of Onset  . Coronary artery disease Maternal Grandfather   . Hypertension Paternal Grandfather    Allergies  Allergen Reactions  . Penicillins     REACTION: As a small child does not remember exact reaction.   Current Outpatient Prescriptions on File Prior to Visit  Medication Sig Dispense Refill  . IUD'S IU by Intrauterine route.        Marland Kitchen ibuprofen (ADVIL,MOTRIN) 200 MG tablet Take otc as directed             Review of Systems Review of Systems  Constitutional: Negative for fever, appetite change, fatigue and unexpected weight change.  Eyes: Negative for pain and visual disturbance.  Respiratory: Negative for cough and shortness of breath.   Cardiovascular: Negative for cp or palpitations    Gastrointestinal: Negative for nausea, diarrhea and constipation.  Genitourinary: pos for urgency and frequency, neg for hematuria and back pain  Skin: Negative for pallor or rash   Neurological: Negative for weakness,  light-headedness, numbness and headaches.  Hematological: Negative for adenopathy. Does not bruise/bleed easily.  Psychiatric/Behavioral: Negative for dysphoric mood. The patient is not nervous/anxious.          Objective:   Physical Exam  Constitutional: She appears well-developed and well-nourished. No distress.  HENT:  Head: Normocephalic and atraumatic.  Mouth/Throat: Oropharynx is clear and moist.  Eyes: Conjunctivae and EOM are normal. Pupils are equal, round, and reactive to light. No scleral icterus.  Neck: Normal range of motion. Neck supple. No thyromegaly present.  Cardiovascular: Normal rate and normal heart sounds.   Pulmonary/Chest: Effort normal and breath sounds normal.  Abdominal: Soft. Bowel sounds are normal. She exhibits no distension and no mass. There is no tenderness.       No suprapubic tenderness  Or fullness  Lymphadenopathy:    She has no cervical adenopathy.  Neurological: She is alert.  Skin: Skin is warm and dry. No rash noted. No pallor.  Psychiatric: She has a normal mood and affect.          Assessment & Plan:

## 2011-09-10 LAB — URINE CULTURE: Colony Count: NO GROWTH

## 2011-09-11 ENCOUNTER — Encounter: Payer: Self-pay | Admitting: Family Medicine

## 2011-09-11 NOTE — Assessment & Plan Note (Signed)
With frequency and urgency and microscopic hematuria  Pt has had pelvic pain lately- and will also see gyn  Will cover with cipro ucx also  Update if not starting to improve in a week or if worsening

## 2011-10-17 ENCOUNTER — Ambulatory Visit: Payer: 59 | Admitting: Family Medicine

## 2011-10-23 ENCOUNTER — Ambulatory Visit (INDEPENDENT_AMBULATORY_CARE_PROVIDER_SITE_OTHER): Payer: 59 | Admitting: Family Medicine

## 2011-10-23 ENCOUNTER — Encounter: Payer: Self-pay | Admitting: Family Medicine

## 2011-10-23 VITALS — BP 102/60 | HR 62 | Temp 97.7°F | Ht 64.0 in | Wt 142.0 lb

## 2011-10-23 DIAGNOSIS — R3129 Other microscopic hematuria: Secondary | ICD-10-CM | POA: Insufficient documentation

## 2011-10-23 DIAGNOSIS — R319 Hematuria, unspecified: Secondary | ICD-10-CM

## 2011-10-23 LAB — POCT URINALYSIS DIPSTICK
Glucose, UA: NEGATIVE
Leukocytes, UA: NEGATIVE
Nitrite, UA: NEGATIVE
Spec Grav, UA: 1.01
Urobilinogen, UA: 0.2

## 2011-10-23 NOTE — Progress Notes (Signed)
  Subjective:    Patient ID: Caitlin Ward, female    DOB: 04-Dec-1980, 31 y.o.   MRN: [id number]  HPI Last visit tx emp with cipro for poss uti cx neg   Saw Dr Rana Snare gyn - small cyst ovarian - told it had likely burst  No more pelvic pain since  He did not think IUD was causing bleeding   No heavy work outs No blood in urine that is visible No menstrual bleeding or spotting  No pelvic pain No back or flank pain    Patient Active Problem List  Diagnoses  . DYSMENORRHEA  . SLEEP DISORDER  . Anxiety and depression  . Hematuria, microscopic   Past Medical History  Diagnosis Date  . Malignant melanoma     left arm- wide excision   Past Surgical History  Procedure Date  . Breast enhancement surgery    History  Substance Use Topics  . Smoking status: Never Smoker   . Smokeless tobacco: Not on file  . Alcohol Use: Not on file   Family History  Problem Relation Age of Onset  . Coronary artery disease Maternal Grandfather   . Hypertension Paternal Grandfather   . Nephrolithiasis Mother    Allergies  Allergen Reactions  . Penicillins     REACTION: As a small child does not remember exact reaction.   Current Outpatient Prescriptions on File Prior to Visit  Medication Sig Dispense Refill  . ibuprofen (ADVIL,MOTRIN) 200 MG tablet Take otc as directed       . IUD'S IU by Intrauterine route.              Review of Systems Review of Systems  Constitutional: Negative for fever, appetite change, fatigue and unexpected weight change.  Eyes: Negative for pain and visual disturbance.  Respiratory: Negative for cough and shortness of breath.   Cardiovascular: Negative for cp or palpitations    Gastrointestinal: Negative for nausea, diarrhea and constipation.  Genitourinary: Negative for urgency and frequency. Neg for visible blood in urine or back or pelvic pain  Skin: Negative for pallor or rash   Neurological: Negative for weakness, light-headedness, numbness and  headaches.  Hematological: Negative for adenopathy. Does not bruise/bleed easily.  Psychiatric/Behavioral: Negative for dysphoric mood. The patient is not nervous/anxious.          Objective:   Physical Exam  Constitutional: She appears well-developed and well-nourished. No distress.  HENT:  Head: Normocephalic and atraumatic.  Mouth/Throat: Oropharynx is clear and moist.  Eyes: Conjunctivae and EOM are normal. Pupils are equal, round, and reactive to light. No scleral icterus.  Neck: Normal range of motion. Neck supple.  Cardiovascular: Normal rate and regular rhythm.   Pulmonary/Chest: Effort normal and breath sounds normal.  Abdominal: Soft. Bowel sounds are normal. She exhibits no distension and no mass. There is no tenderness.       No suprapubic tenderness    Musculoskeletal: She exhibits no edema and no tenderness.       No cva tenderness   Neurological: She is alert.  Skin: Skin is warm and dry. No rash noted. No erythema. No pallor.  Psychiatric: She has a normal mood and affect.          Assessment & Plan:

## 2011-10-23 NOTE — Patient Instructions (Signed)
You still have microscopic blood in your urine  I'm glad you are feeling better We will do urology referral at check out

## 2011-10-23 NOTE — Assessment & Plan Note (Signed)
2nd ua with large blood  Had gyn eval- fine since ov cyst  No bleeding or spotting with iud No sympotms at all  Will ref to urol for further eval

## 2011-10-29 LAB — POCT UA - MICROSCOPIC ONLY
Bacteria, U Microscopic: 0
Crystals, Ur, HPF, POC: 0

## 2011-11-03 ENCOUNTER — Ambulatory Visit (INDEPENDENT_AMBULATORY_CARE_PROVIDER_SITE_OTHER)
Admission: RE | Admit: 2011-11-03 | Discharge: 2011-11-03 | Disposition: A | Discharge status: Home / Self Care | Payer: 59 | Source: Ambulatory Visit | Attending: Family Medicine | Admitting: Family Medicine

## 2011-11-03 ENCOUNTER — Ambulatory Visit (INDEPENDENT_AMBULATORY_CARE_PROVIDER_SITE_OTHER): Payer: 59 | Admitting: Family Medicine

## 2011-11-03 ENCOUNTER — Telehealth: Payer: Self-pay | Admitting: Family Medicine

## 2011-11-03 ENCOUNTER — Encounter: Payer: Self-pay | Admitting: Family Medicine

## 2011-11-03 VITALS — BP 100/60 | HR 59 | Temp 97.9°F | Ht 64.0 in | Wt 140.4 lb

## 2011-11-03 DIAGNOSIS — R3129 Other microscopic hematuria: Secondary | ICD-10-CM

## 2011-11-03 DIAGNOSIS — R300 Dysuria: Secondary | ICD-10-CM

## 2011-11-03 LAB — POCT URINALYSIS DIPSTICK
Ketones, UA: NEGATIVE
Leukocytes, UA: NEGATIVE
Nitrite, UA: NEGATIVE
Protein, UA: NEGATIVE
Urobilinogen, UA: NEGATIVE

## 2011-11-03 MED ORDER — CIPROFLOXACIN HCL 500 MG PO TABS: 500.0000 mg | ORAL_TABLET | Freq: Two times a day (BID) | ORAL | Status: AC

## 2011-11-03 NOTE — Telephone Encounter (Signed)
She needs to be checked out for uti , if no appts available please schedule Saturday clinic I am out of the office in a conference right now

## 2011-11-03 NOTE — Progress Notes (Signed)
  Subjective:    Patient ID: Caitlin Ward, female    DOB: Dec 07, 1980, 31 y.o.   MRN: [id number]  HPI 31 pt of Dr. Milinda Antis here for ? UTI.  Has appt with urology scheduled for next week for persistent hematuria. She has been treated with emperic abx for hematuria, cx neg on 2/8.   Last week, RLQ abdominal pain that resolved. This morning, pain returned but now bilateral, suprapubic and worse with urination.  Very sharp. No increased urinary frequency. No fever. No nausea or vomiting. No back pain.    Patient Active Problem List  Diagnoses  . DYSMENORRHEA  . SLEEP DISORDER  . Anxiety and depression  . Hematuria, microscopic  . Dysuria   Past Medical History  Diagnosis Date  . Malignant melanoma     left arm- wide excision   Past Surgical History  Procedure Date  . Breast enhancement surgery    History  Substance Use Topics  . Smoking status: Never Smoker   . Smokeless tobacco: Not on file  . Alcohol Use: Not on file   Family History  Problem Relation Age of Onset  . Coronary artery disease Maternal Grandfather   . Hypertension Paternal Grandfather   . Nephrolithiasis Mother    Allergies  Allergen Reactions  . Penicillins     REACTION: As a small child does not remember exact reaction.   Current Outpatient Prescriptions on File Prior to Visit  Medication Sig Dispense Refill  . ibuprofen (ADVIL,MOTRIN) 200 MG tablet Take otc as directed       . IUD'S IU by Intrauterine route.              Review of Systems See HPI        Objective:   Physical Exam  BP 100/60  Pulse 59  Temp(Src) 97.9 F (36.6 C) (Oral)  Ht 5\' 4"  (1.626 m)  Wt 140 lb 6.4 oz (63.685 kg)  BMI 24.10 kg/m2  SpO2 98%  Constitutional: She appears well-developed and well-nourished. No distress.  HENT:  Head: Normocephalic and atraumatic.  Mouth/Throat: Oropharynx is clear and moist.  Eyes: Conjunctivae and EOM are normal. Pupils are equal, round, and reactive to light. No  scleral icterus.  Neck: Normal range of motion. Neck supple.  Cardiovascular: Normal rate and regular rhythm.   Pulmonary/Chest: Effort normal and breath sounds normal.  Abdominal: Soft. Bowel sounds are normal. She exhibits no distension and no mass. There is no tenderness.       Pos suprapubic tenderness    Musculoskeletal: She exhibits no edema and no tenderness.       No cva tenderness   Neurological: She is alert.  Skin: Skin is warm and dry. No rash noted. No erythema. No pallor.  Psychiatric: She has a normal mood and affect.       Assessment & Plan:   1. Hematuria, microscopic  Persistent now with abdominal pain and otherwise neg UA. ?poss nephrolithaisis vs IC. Keep appt with urology. Will order CT of abdomen and pelvis to evaluate further. Place on empiric cipro -500 mg twice daily x 3 days.  Send urine for cx. The patient indicates understanding of these issues and agrees with the plan.  Urine Culture, CT Abdomen Pelvis Wo Contrast  2. Dysuria  See above. POCT Urinalysis Dipstick, CT Abdomen Pelvis Wo Contrast

## 2011-11-03 NOTE — Patient Instructions (Signed)
Take cipro as directed - 1 tablet twice daily for 3 days. Keep appt with urology. Stop by to see Shirlee Limerick on your way out to set up CT scan.

## 2011-11-03 NOTE — Telephone Encounter (Signed)
Caller: Marshella/Patient; PCP: Tower, Marne A.; CB#: [phone]; ; ; Call regarding Referred To Urologist for Blood in Urine; states she is now having abdominal/suprapubic pain.  Has appt with urologist 11/09/11.  No blood visible; states seen microscopically.  States pain occurs when voiding, but no burning or irritation noted.  Mild back pain noted.  Per protocol, See Within 4 Hours' disposition; unable to obtain appt within 4 hours.  Has been evaluated recently for this and no treatment started; appt with urologist not for another week.  Advised UC; declines.   INFO TO OFFICE FOR PROVIDER REVIEW/CALLBACK. MAY REACH [phone].

## 2011-11-03 NOTE — Telephone Encounter (Signed)
Patient advised as instructed via telephone, scheduled appt this morning with Dr. Dayton Martes at 11:00.

## 2011-11-05 LAB — URINE CULTURE: Colony Count: NO GROWTH

## 2012-01-05 ENCOUNTER — Telehealth: Payer: Self-pay | Admitting: *Deleted

## 2012-01-05 NOTE — Telephone Encounter (Signed)
Per MAT Vo, informed patient that CT report showed liver cyst that radiologist suggest be monitored with 6-mth follow up MRI; patient understood & agreed, she states tha she trust your judgement as to whomever you wish to schedule procedure with/SLS

## 2012-03-05 ENCOUNTER — Telehealth: Payer: Self-pay | Admitting: Family Medicine

## 2012-03-05 DIAGNOSIS — K769 Liver disease, unspecified: Secondary | ICD-10-CM

## 2012-03-05 NOTE — Telephone Encounter (Signed)
Pt is calling and wanting to know about her F/U MRI ??? She recently had one and they had spotted something small on her liver and asked that she schedule a 6 month f/u for that ??

## 2012-03-05 NOTE — Telephone Encounter (Signed)
I have in my reminders that she will be due for MRI in December of 2013 -- if they will let us schedule it this much in advance I'm happy to do that - please call and find out, thanks

## 2012-03-06 NOTE — Telephone Encounter (Signed)
Spoke with [employer]. They say they can schedule out that far if you wanted me to go ahead and schedule. I would just need an order put in. Thanks so much Dr. Milinda Antis.

## 2012-03-07 DIAGNOSIS — K769 Liver disease, unspecified: Secondary | ICD-10-CM | POA: Insufficient documentation

## 2012-03-07 NOTE — Telephone Encounter (Signed)
Please add to the order - for December 2013-thanks

## 2012-03-07 NOTE — Telephone Encounter (Signed)
Please make the MRI in dec 2013-thanks

## 2012-03-07 NOTE — Telephone Encounter (Signed)
Will order.

## 2012-03-08 ENCOUNTER — Telehealth: Payer: Self-pay | Admitting: Family Medicine

## 2012-03-08 DIAGNOSIS — K769 Liver disease, unspecified: Secondary | ICD-10-CM

## 2012-03-08 NOTE — Telephone Encounter (Signed)
Spoke with [facility] she said to type in MR  ABD and MRI w and w/o should pop up to be able to select it.Let me know if it doesn't work and I can contact them again.

## 2012-03-08 NOTE — Telephone Encounter (Signed)
Spoke with Pt Caitlin Ward is fine to set up 6 month MR of Abdomen. [name] is saying the order needs to be changed to MR Abdomen W and WO Contrast.

## 2012-03-08 NOTE — Telephone Encounter (Signed)
I could not find that when I went to order it -- I just found MRI with and MRI without contrast    (there was MRA w and w/o but not just MRI)-- any ideas on how to order it? Thanks I sent this to [facility], [facility] and [place]

## 2012-03-08 NOTE — Telephone Encounter (Signed)
I could not find MRI with and without when I went to order (just MRA)- any ideas on how to order that test?  Will send this to [place] and Shirlee Limerick

## 2012-03-09 NOTE — Telephone Encounter (Signed)
Done, thanks -- could not find using MRI-- had to be just MR--thanks

## 2012-03-29 ENCOUNTER — Ambulatory Visit (INDEPENDENT_AMBULATORY_CARE_PROVIDER_SITE_OTHER)
Admission: RE | Admit: 2012-03-29 | Discharge: 2012-03-29 | Disposition: A | Discharge status: Home / Self Care | Payer: 59 | Source: Ambulatory Visit | Attending: Family Medicine | Admitting: Family Medicine

## 2012-03-29 ENCOUNTER — Ambulatory Visit (INDEPENDENT_AMBULATORY_CARE_PROVIDER_SITE_OTHER): Payer: 59 | Admitting: Family Medicine

## 2012-03-29 ENCOUNTER — Encounter: Payer: Self-pay | Admitting: Family Medicine

## 2012-03-29 VITALS — BP 124/74 | HR 92 | Temp 98.3°F | Ht 64.0 in | Wt 137.0 lb

## 2012-03-29 DIAGNOSIS — R109 Unspecified abdominal pain: Secondary | ICD-10-CM

## 2012-03-29 DIAGNOSIS — R1030 Lower abdominal pain, unspecified: Secondary | ICD-10-CM

## 2012-03-29 NOTE — Patient Instructions (Addendum)
Abdominal xray today Make sure to drink enough water  Take citrucel as directed mixed with water once daily  We will update you with result and plan

## 2012-03-29 NOTE — Progress Notes (Signed)
Subjective:    Patient ID: Caitlin Ward, female    DOB: [birth year], 31 y.o.   MRN: [id number]  HPI Here for a bowel issue - constant pain/ discomfort for several months  Does not feel like she is completely emptying her bowel  Goes small amount every day several times -- just small amount ? If this is related to bladder discomfort  Intermittent abdominal pain- low abd and very bloated Dull pain , a little crampiness , and right in middle   No gyn problems   No blood in stool  She has tried to step up her fiber intake - food / flax seed   Tried to cut out caffeine and acidic foods  Had a full w/u from urology- all normal   Yesterday was dx with strep -- went to urgent -on cefdinir  3rd dose- is improved form yest am   Patient Active Problem List  Diagnosis  . DYSMENORRHEA  . SLEEP DISORDER  . Anxiety and depression  . Hematuria, microscopic  . Dysuria  . Hepatic lesion   Past Medical History  Diagnosis Date  . Malignant melanoma     left arm- wide excision   Past Surgical History  Procedure Date  . Breast enhancement surgery    History  Substance Use Topics  . Smoking status: Never Smoker   . Smokeless tobacco: Not on file  . Alcohol Use: Not on file   Family History  Problem Relation Age of Onset  . Coronary artery disease Maternal Grandfather   . Hypertension Paternal Grandfather   . Nephrolithiasis Mother    Allergies  Allergen Reactions  . Penicillins     REACTION: As a small child does not remember exact reaction.   Current Outpatient Prescriptions on File Prior to Visit  Medication Sig Dispense Refill  . ibuprofen (ADVIL,MOTRIN) 200 MG tablet Take otc as directed       . IUD'S IU by Intrauterine route.              Review of Systems Review of Systems  Constitutional: Negative for fever, appetite change, fatigue and unexpected weight change.  Eyes: Negative for pain and visual disturbance.  Respiratory: Negative for cough and shortness  of breath.   Cardiovascular: Negative for cp or palpitations    Gastrointestinal: Negative for nausea, diarrhea and constipation. no n/v, no blood in stool  Genitourinary: Negative for urgency and frequency. no gyn symptoms, no chance pregnant  Skin: Negative for pallor or rash   Neurological: Negative for weakness, light-headedness, numbness and headaches.  Hematological: Negative for adenopathy. Does not bruise/bleed easily.  Psychiatric/Behavioral: Negative for dysphoric mood. The patient is not nervous/anxious.  - much less stress lately       Objective:   Physical Exam  Constitutional: She appears well-developed and well-nourished. No distress.  HENT:  Head: Normocephalic and atraumatic.  Eyes: Conjunctivae and EOM are normal. Pupils are equal, round, and reactive to light. Right eye exhibits no discharge. Left eye exhibits no discharge. No scleral icterus.  Neck: Normal range of motion. Neck supple.  Cardiovascular: Normal rate and regular rhythm.   Pulmonary/Chest: Effort normal and breath sounds normal.  Abdominal: Soft. Bowel sounds are normal. She exhibits no distension, no abdominal bruit, no ascites and no mass. There is no hepatosplenomegaly. There is tenderness. There is no rebound, no guarding and negative Murphy's sign.       Mildly tender over low abdomen without rebound or guarding  Musculoskeletal: She exhibits no edema.  Neurological: She is alert.  Skin: Skin is warm and dry. No erythema. No pallor.  Psychiatric: She has a normal mood and affect.          Assessment & Plan:

## 2012-03-29 NOTE — Assessment & Plan Note (Addendum)
Pt has low abdominal bloating for several months - with feeling of incompletely emptying bowels - but no hard stools/ blood or other symptoms  Could be IBS, but pt does hx of malignant melanoma in the past and need to be wary of bowel obst/ etc Xray abdomen today  Also start fiber supplement Then update  If no improvement - will refer to GI

## 2012-05-23 ENCOUNTER — Telehealth: Payer: Self-pay | Admitting: Family Medicine

## 2012-05-23 NOTE — Telephone Encounter (Signed)
Caller: Skie/Patient; Patient Name: Caitlin Ward; PCP: Roxy Manns [facility]); Best Callback Phone Number: [phone] Onset-1 month per pt.   Afebrile.  She c/o of aching in her legs, feet, arms and hands. It is most persistent in legs and feet. She denies swelling. She rates the pain in legs at a 6. Ibuprofen does not help. Emergent s/s of Leg non-injury protocol r/o. Pt to see provider wihtin 24hrs. Pt declines an appointment. She is requesting a message to provider to see if she can recommend anything else for her to try for s/s.

## 2012-05-23 NOTE — Telephone Encounter (Signed)
This can wait.  Will defer to PCP who knows her.

## 2012-05-23 NOTE — Telephone Encounter (Signed)
Since I do not know what is causing her symptoms - I think she needs to schedule an appt

## 2012-05-24 NOTE — Telephone Encounter (Signed)
Pt scheduled appt for 05/28/12

## 2012-05-28 ENCOUNTER — Ambulatory Visit (INDEPENDENT_AMBULATORY_CARE_PROVIDER_SITE_OTHER)
Admission: RE | Admit: 2012-05-28 | Discharge: 2012-05-28 | Disposition: A | Discharge status: Home / Self Care | Payer: 59 | Source: Ambulatory Visit | Attending: Family Medicine | Admitting: Family Medicine

## 2012-05-28 ENCOUNTER — Encounter: Payer: Self-pay | Admitting: Family Medicine

## 2012-05-28 ENCOUNTER — Ambulatory Visit (INDEPENDENT_AMBULATORY_CARE_PROVIDER_SITE_OTHER): Payer: 59 | Admitting: Family Medicine

## 2012-05-28 VITALS — BP 106/72 | HR 72 | Temp 98.6°F | Ht 64.0 in | Wt 135.5 lb

## 2012-05-28 DIAGNOSIS — M79661 Pain in right lower leg: Secondary | ICD-10-CM

## 2012-05-28 DIAGNOSIS — M7918 Myalgia, other site: Secondary | ICD-10-CM | POA: Insufficient documentation

## 2012-05-28 DIAGNOSIS — IMO0001 Reserved for inherently not codable concepts without codable children: Secondary | ICD-10-CM

## 2012-05-28 DIAGNOSIS — M79609 Pain in unspecified limb: Secondary | ICD-10-CM

## 2012-05-28 DIAGNOSIS — M2550 Pain in unspecified joint: Secondary | ICD-10-CM

## 2012-05-28 MED ORDER — MELOXICAM 15 MG PO TABS: 15.0000 mg | ORAL_TABLET | Freq: Every day | ORAL | Status: DC

## 2012-05-28 NOTE — Assessment & Plan Note (Signed)
Chose this area to xray since pain is the worst here  Has remote hx of melanoma but very much doubt poss of bone mets at this time

## 2012-05-28 NOTE — Progress Notes (Signed)
Subjective:    Patient ID: Caitlin Ward, female    DOB: 11-Dec-1980, 31 y.o.   MRN: [id number]  HPI Here with a lot of body pain Aching in legs and feet and arms and hands   Like a toothache almost all the time Is worse at night  Almost feels like body aches from a fever - but not a fever  Very sensitive to even her clothes touching her   Is daily For the past month Just very uncomfortable  Takes 800 of ibuprofen with food no more than once per day  Does not help much   Worst pain is in calf/ ankle/knee  Has not been exercising much Went for a run last night-no change Is very busy and on the go  Was a little more stressed for 2 weeks -- just really busy - full time and with family- but nothing major going on   gmother - RA for years   No swollen joints  Feels the need to "pop" them  Hurts more actually in between joints   Had strep throat about 2 mo ago -otherwise is fine  Patient Active Problem List  Diagnosis  . DYSMENORRHEA  . SLEEP DISORDER  . Anxiety and depression  . Hematuria, microscopic  . Dysuria  . Hepatic lesion  . Abdominal pain, lower   Past Medical History  Diagnosis Date  . Malignant melanoma     left arm- wide excision   Past Surgical History  Procedure Date  . Breast enhancement surgery    History  Substance Use Topics  . Smoking status: Never Smoker   . Smokeless tobacco: Not on file  . Alcohol Use: No   Family History  Problem Relation Age of Onset  . Coronary artery disease Maternal Grandfather   . Hypertension Paternal Grandfather   . Nephrolithiasis Mother    Allergies  Allergen Reactions  . Penicillins     REACTION: As a small child does not remember exact reaction.   Current Outpatient Prescriptions on File Prior to Visit  Medication Sig Dispense Refill  . ibuprofen (ADVIL,MOTRIN) 200 MG tablet Take otc as directed       . IUD'S IU by Intrauterine route.           Review of Systems Review of Systems    Constitutional: Negative for fever, appetite change, fatigue and unexpected weight change.  Eyes: Negative for pain and visual disturbance.  Respiratory: Negative for cough and shortness of breath.   Cardiovascular: Negative for cp or palpitations    Gastrointestinal: Negative for nausea, diarrhea and constipation.  Genitourinary: Negative for urgency and frequency.  Skin: Negative for pallor or rash   MSK pos for joint and muscle pain , neg for joint swelling/redness/warmth or rash  Neurological: Negative for weakness, light-headedness, numbness and headaches.  Hematological: Negative for adenopathy. Does not bruise/bleed easily.  Psychiatric/Behavioral: Negative for dysphoric mood. The patient is not nervous/anxious.  (mood is good and stressors are stable)       Objective:   Physical Exam  Constitutional: She appears well-developed and well-nourished. No distress.  HENT:  Head: Normocephalic and atraumatic.  Mouth/Throat: Oropharynx is clear and moist. No oropharyngeal exudate.  Eyes: Conjunctivae normal and EOM are normal. Pupils are equal, round, and reactive to light. Right eye exhibits no discharge. Left eye exhibits no discharge. No scleral icterus.  Neck: Normal range of motion. Neck supple. No JVD present. No thyromegaly present.  Cardiovascular: Normal rate, regular rhythm, normal heart sounds  and intact distal pulses.  Exam reveals no gallop.   No murmur heard. Pulmonary/Chest: Effort normal and breath sounds normal. No respiratory distress. She has no wheezes.  Abdominal: Soft. Bowel sounds are normal. She exhibits no distension and no mass. There is no tenderness.  Musculoskeletal: Normal range of motion. She exhibits tenderness. She exhibits no edema.       No acute joint swelling/redness/ deformity or tenderness Also no crepitus or rash   Tender over bilateral quadriceps and calves    Lymphadenopathy:    She has no cervical adenopathy.  Neurological: She is alert.  She has normal reflexes. She displays no atrophy and no tremor. No cranial nerve deficit. She exhibits normal muscle tone. Coordination and gait normal.  Skin: Skin is warm and dry. No rash noted. No erythema. No pallor.  Psychiatric: She has a normal mood and affect.       Mood is good - does not seem overly fatigued          Assessment & Plan:

## 2012-05-28 NOTE — Patient Instructions (Addendum)
Labs today  Xray today Follow up in 1-2 weeks to discuss result  Try mobic for pain

## 2012-05-28 NOTE — Assessment & Plan Note (Signed)
Pain in areas of body between joints - with occ tenderness Lab today  Xray of R leg where it is most prominent (tib fib) Diff incl fibromyalgia or widespread pain syndrome , less likely auto immune connective tissue process, or bone mets from remote melanoma F/u to disc results

## 2012-05-28 NOTE — Assessment & Plan Note (Signed)
Without deformity/ swelling/ redness/ warmth or tenderness at this time also with muscle/ myofacial pain Will check labs for autoimmune dz but suspect neg This has followed a stressful several years  Will disc at f/u Given px for mobic to try instead of otc nsaids

## 2012-05-29 LAB — CBC WITH DIFFERENTIAL/PLATELET
Basophils Absolute: 0 10*3/uL (ref 0.0–0.2)
Eosinophils Absolute: 0.3 10*3/uL (ref 0.0–0.4)
Immature Grans (Abs): 0 10*3/uL (ref 0.0–0.1)
Lymphs: 34 % (ref 14–46)
MCH: 30.2 pg (ref 26.6–33.0)
MCHC: 34.7 g/dL (ref 31.5–35.7)
MCV: 87 fL (ref 79–97)
Monocytes Absolute: 0.5 10*3/uL (ref 0.1–0.9)
Neutrophils Relative %: 56 % (ref 40–74)
RDW: 13 % (ref 12.3–15.4)

## 2012-05-29 LAB — TSH: TSH: 1.35 u[IU]/mL (ref 0.450–4.500)

## 2012-06-04 ENCOUNTER — Telehealth: Payer: Self-pay | Admitting: Family Medicine

## 2012-06-04 DIAGNOSIS — M7918 Myalgia, other site: Secondary | ICD-10-CM

## 2012-06-04 DIAGNOSIS — M2550 Pain in unspecified joint: Secondary | ICD-10-CM

## 2012-06-04 DIAGNOSIS — M79661 Pain in right lower leg: Secondary | ICD-10-CM

## 2012-06-04 NOTE — Telephone Encounter (Signed)
Message copied by Judy Pimple on Tue Jun 04, 2012 12:42 PM ------      Message from: Shon Millet      Created: Tue Jun 04, 2012 12:38 PM       Pt notified of your recommendations and agrees to see rheumatologist, needs referral

## 2012-06-04 NOTE — Telephone Encounter (Signed)
Will ref to rheumatology

## 2012-07-15 ENCOUNTER — Ambulatory Visit
Admission: RE | Admit: 2012-07-15 | Discharge: 2012-07-15 | Disposition: A | Discharge status: Home / Self Care | Payer: 59 | Source: Ambulatory Visit | Attending: Family Medicine | Admitting: Family Medicine

## 2012-07-15 DIAGNOSIS — K769 Liver disease, unspecified: Secondary | ICD-10-CM

## 2012-07-15 MED ORDER — GADOXETATE DISODIUM 0.25 MMOL/ML IV SOLN
6.0000 mL | Freq: Once | INTRAVENOUS | Status: AC | PRN
  Administered 2012-07-15: 6 mL via INTRAVENOUS

## 2012-07-21 ENCOUNTER — Telehealth: Payer: Self-pay | Admitting: Family Medicine

## 2012-07-21 DIAGNOSIS — Z0001 Encounter for general adult medical examination with abnormal findings: Secondary | ICD-10-CM | POA: Insufficient documentation

## 2012-07-21 DIAGNOSIS — Z0000 Encounter for general adult medical examination without abnormal findings: Secondary | ICD-10-CM

## 2012-07-21 NOTE — Telephone Encounter (Signed)
Message copied by Judy Pimple on Sun Jul 21, 2012  3:19 PM ------      Message from: Caitlin Ward      Created: Tue Jul 16, 2012 12:41 PM      Regarding: Cpx labs Mon 12/23       Please order  future cpx labs for pt's upcoming lab appt.      Thanks      Rodney Booze

## 2012-07-22 ENCOUNTER — Other Ambulatory Visit (INDEPENDENT_AMBULATORY_CARE_PROVIDER_SITE_OTHER): Payer: 59

## 2012-07-22 DIAGNOSIS — Z0000 Encounter for general adult medical examination without abnormal findings: Secondary | ICD-10-CM

## 2012-07-22 LAB — COMPREHENSIVE METABOLIC PANEL
ALT: 14 U/L (ref 0–35)
AST: 17 U/L (ref 0–37)
Albumin: 4 g/dL (ref 3.5–5.2)
BUN: 12 mg/dL (ref 6–23)
CO2: 25 mEq/L (ref 19–32)
Calcium: 9 mg/dL (ref 8.4–10.5)
Chloride: 105 mEq/L (ref 96–112)
Creatinine, Ser: 0.7 mg/dL (ref 0.4–1.2)
GFR: 111.18 mL/min (ref >60.00)
Potassium: 4.5 mEq/L (ref 3.5–5.1)

## 2012-07-22 LAB — LIPID PANEL
Cholesterol: 181 mg/dL (ref 0–200)
HDL: 36.2 mg/dL — ABNORMAL LOW (ref >39.00)
Triglycerides: 125 mg/dL (ref 0.0–149.0)
VLDL: 25 mg/dL (ref 0.0–40.0)

## 2012-07-22 LAB — TSH: TSH: 1.42 u[IU]/mL (ref 0.35–5.50)

## 2012-07-30 ENCOUNTER — Encounter: Payer: Self-pay | Admitting: Family Medicine

## 2012-07-30 ENCOUNTER — Other Ambulatory Visit (HOSPITAL_COMMUNITY)
Admission: RE | Admit: 2012-07-30 | Discharge: 2012-07-30 | Disposition: A | Discharge status: Home / Self Care | Payer: 59 | Source: Ambulatory Visit | Attending: Family Medicine | Admitting: Family Medicine

## 2012-07-30 ENCOUNTER — Ambulatory Visit (INDEPENDENT_AMBULATORY_CARE_PROVIDER_SITE_OTHER): Payer: 59 | Admitting: Family Medicine

## 2012-07-30 VITALS — BP 104/66 | HR 65 | Temp 98.0°F | Ht 64.25 in | Wt 140.2 lb

## 2012-07-30 DIAGNOSIS — Z0000 Encounter for general adult medical examination without abnormal findings: Secondary | ICD-10-CM

## 2012-07-30 DIAGNOSIS — Z1151 Encounter for screening for human papillomavirus (HPV): Secondary | ICD-10-CM | POA: Insufficient documentation

## 2012-07-30 DIAGNOSIS — Z01419 Encounter for gynecological examination (general) (routine) without abnormal findings: Secondary | ICD-10-CM | POA: Insufficient documentation

## 2012-07-30 NOTE — Assessment & Plan Note (Signed)
Annual gyn eval with pap done  No complaints Has IUD and is very happy with that  Not interested in STD tests

## 2012-07-30 NOTE — Patient Instructions (Addendum)
Pap today Labs look ok  Keep watching diet for saturated fats (Avoid red meat/ fried foods/ egg yolks/ fatty breakfast meats/ butter, cheese and high fat dairy/ and shellfish  ) Keep up the good exercise

## 2012-07-30 NOTE — Assessment & Plan Note (Signed)
Reviewed health habits including diet and exercise and skin cancer prevention Also reviewed health mt list, fam hx and immunizations  Rev wellness labs in detail 

## 2012-07-30 NOTE — Progress Notes (Signed)
Subjective:    Patient ID: Caitlin Ward, female    DOB: 01/10/1981, 31 y.o.   MRN: [id number]  HPI Here for health maintenance exam and to review chronic medical problems    Is doing well   Wt is up 5 lb with bmi of 23 Flu vaccine - got that in October   Needs her pap smear - last one 6/11 Has IUD (Sees dr Rana Snare for that)- and that is going very well - occ spotting    Had MRI of abd- to re image liver Saw B9 cyst  Lab Results  Component Value Date   CHOL 181 07/22/2012   CHOL 187 01/20/2010   Lab Results  Component Value Date   HDL 36.20* 07/22/2012   HDL 42.50 01/20/2010   Lab Results  Component Value Date   LDLCALC 120* 07/22/2012   LDLCALC 118* 01/20/2010   Lab Results  Component Value Date   TRIG 125.0 07/22/2012   TRIG 135.0 01/20/2010   Lab Results  Component Value Date   CHOLHDL 5 07/22/2012   CHOLHDL 4 01/20/2010   No results found for this basename: LDLDIRECT    Is running more - and also eats a healthy diet- better not around the holidays  Is eating more green vegetables   Patient Active Problem List  Diagnosis  . DYSMENORRHEA  . SLEEP DISORDER  . Anxiety and depression  . Hematuria, microscopic  . Dysuria  . Hepatic lesion  . Abdominal pain, lower  . Joint pain  . Myofascial pain  . Pain of right lower leg  . Routine general medical examination at a health care facility   Past Medical History  Diagnosis Date  . Malignant melanoma     left arm- wide excision   Past Surgical History  Procedure Date  . Breast enhancement surgery    History  Substance Use Topics  . Smoking status: Never Smoker   . Smokeless tobacco: Not on file  . Alcohol Use: No   Family History  Problem Relation Age of Onset  . Coronary artery disease Maternal Grandfather   . Hypertension Paternal Grandfather   . Nephrolithiasis Mother    Allergies  Allergen Reactions  . Penicillins     REACTION: As a small child does not remember exact reaction.    Current Outpatient Prescriptions on File Prior to Visit  Medication Sig Dispense Refill  . IUD'S IU by Intrauterine route.            Review of Systems Review of Systems  Constitutional: Negative for fever, appetite change, fatigue and unexpected weight change.  Eyes: Negative for pain and visual disturbance.  Respiratory: Negative for cough and shortness of breath.   Cardiovascular: Negative for cp or palpitations    Gastrointestinal: Negative for nausea, diarrhea and constipation.  Genitourinary: Negative for urgency and frequency.  Skin: Negative for pallor or rash   Neurological: Negative for weakness, light-headedness, numbness and headaches.  Hematological: Negative for adenopathy. Does not bruise/bleed easily.  Psychiatric/Behavioral: Negative for dysphoric mood. The patient is not nervous/anxious.         Objective:   Physical Exam  Constitutional: She appears well-developed and well-nourished. No distress.  HENT:  Head: Normocephalic and atraumatic.  Right Ear: External ear normal.  Left Ear: External ear normal.  Nose: Nose normal.  Mouth/Throat: Oropharynx is clear and moist.  Eyes: Conjunctivae normal and EOM are normal. Pupils are equal, round, and reactive to light. Right eye exhibits no discharge. Left eye  exhibits no discharge. No scleral icterus.  Neck: Normal range of motion. Neck supple. No JVD present. Carotid bruit is not present. No thyromegaly present.  Cardiovascular: Normal rate, regular rhythm, normal heart sounds and intact distal pulses.  Exam reveals no gallop.   Pulmonary/Chest: Effort normal and breath sounds normal. No respiratory distress. She has no wheezes.  Abdominal: Soft. Bowel sounds are normal. She exhibits no distension, no abdominal bruit and no mass. There is no tenderness.  Genitourinary: No breast swelling, tenderness, discharge or bleeding. There is no rash, tenderness or lesion on the right labia. There is no rash, tenderness or  lesion on the left labia. Uterus is not enlarged and not tender. Cervix exhibits no motion tenderness, no discharge and no friability. Right adnexum displays no mass, no tenderness and no fullness. Left adnexum displays no mass, no tenderness and no fullness. No bleeding around the vagina. No vaginal discharge found.       Breast exam: No mass, nodules, thickening, tenderness, bulging, retraction, inflamation, nipple discharge or skin changes noted.  No axillary or clavicular LA.  Chaperoned exam.   IUD strings visualized   Musculoskeletal: Normal range of motion. She exhibits no edema and no tenderness.  Lymphadenopathy:    She has no cervical adenopathy.  Neurological: She is alert. She has normal reflexes. No cranial nerve deficit. She exhibits normal muscle tone. Coordination normal.  Skin: Skin is warm and dry. No rash noted. No erythema. No pallor.  Psychiatric: She has a normal mood and affect.          Assessment & Plan:

## 2012-08-06 ENCOUNTER — Encounter: Payer: Self-pay | Admitting: *Deleted

## 2012-11-11 ENCOUNTER — Telehealth: Payer: Self-pay | Admitting: Family Medicine

## 2012-11-11 NOTE — Telephone Encounter (Signed)
Patient Information:  Caller Name: [name]  Phone: [phone]  Patient: Caitlin Ward  Gender: Female  DOB: [birth year]  Age: 32 Years  PCP: Roxy Manns [facility])  Pregnant: No  Office Follow Up:  Does the office need to follow up with this patient?: No  Instructions For The Office: N/A   Symptoms  Reason For Call & Symptoms: Onset 11/07/2012 abd pain and constipation.  Low grade fever 11/08/2012.  Nausea and diarrhea 11/09/2012.  Feel worse after meals with burning pain right upper quadrant abd, rates 6 on 0-10 acale.  Diarrhea 4-5 times 11/11/2012.  Reviewed Health History In EMR: Yes  Reviewed Medications In EMR: Yes  Reviewed Allergies In EMR: Yes  Reviewed Surgeries / Procedures: Yes  Date of Onset of Symptoms: 11/07/2012  Treatments Tried: Dried prunes for constipationo on 11/07/2012.  Antacid and Pepto Bismol on 11/08/2012  Treatments Tried Worked: No OB / GYN:  LMP: Unknown  Guideline(s) Used:  Abdominal Pain - Female  Abdominal Pain - Upper  Disposition Per Guideline:   See Within 2 Weeks in Office  Reason For Disposition Reached:   Intermittent burning pains radiating into chest or sour taste in mouth  Advice Given:  Reassurance:  Here is some care advice that should help.  Fluids:   Sip clear fluids only (e.g., water, flat soft drinks, or half-strength fruit juice) until the pain is gone for 2 hours. Then slowly return to a regular diet.  Diet:  Slowly advance diet from clear liquids to a bland diet.  Avoid alcohol or caffeinated beverages.  Avoid greasy or fatty foods.  Antacid:  If having pain now, try taking an antacid (e.g., Mylanta, Maalox). Dose: 2 tablespoons (30 ml) of liquid by mouth.  Avoid NSAIDS and Aspirin  : Avoid any drug that can irritate the stomach lining and make the pain worse (especially aspirin and NSAIDs like ibuprofen).  Reducing Reflux Symptoms (GERD):  Eat smaller meals and avoid snacks for 2 hours before sleeping.  Avoid the following foods, which tend to aggravate heartburn and stomach problems: fatty/greasy foods, spicy foods, caffeinated beverages, mints, and chocolate.  Expected Course:  With harmless causes, the pain usually lessens or is resolved in 2 hours. With gastroenteritis, stomach cramps may precede each bout of vomiting or diarrhea. With serious causes (such as appendicitis), the pain becomes constant and severe.  Patient Will Follow Care Advice:  YES  Appointment Scheduled:  11/18/2012 08:30:00 Appointment Scheduled Provider:  Roxy Manns [facility])

## 2012-11-11 NOTE — Telephone Encounter (Signed)
Will see her then 

## 2012-11-18 ENCOUNTER — Encounter: Payer: Self-pay | Admitting: Family Medicine

## 2012-11-18 ENCOUNTER — Ambulatory Visit (INDEPENDENT_AMBULATORY_CARE_PROVIDER_SITE_OTHER): Payer: 59 | Admitting: Family Medicine

## 2012-11-18 VITALS — BP 102/62 | HR 74 | Temp 98.3°F | Ht 64.25 in | Wt 135.5 lb

## 2012-11-18 DIAGNOSIS — R1011 Right upper quadrant pain: Secondary | ICD-10-CM | POA: Insufficient documentation

## 2012-11-18 MED ORDER — RANITIDINE HCL 150 MG PO CAPS: 150.0000 mg | ORAL_CAPSULE | Freq: Two times a day (BID) | ORAL | Status: DC

## 2012-11-18 NOTE — Assessment & Plan Note (Signed)
Known tiny hepatic cysts and no known gallstones Assoc with prev nausea/ low grade fever/ bowel changes ? Poss viral gastroenteritis  tx with zantac 2-4 wk and otc align  Lab today If nl lab and no im 2 weeks will ref to GI

## 2012-11-18 NOTE — Progress Notes (Signed)
Subjective:    Patient ID: Caitlin Ward, female    DOB: 11/13/1980, 32 y.o.   MRN: [id number]  HPI Having stomach issues - started before a recent vacation Cramping but could not have BM  (MOM helped) - then had a low grade fever  Nauseated but never vomited  On her trip - just not moving bowels well  Then after eating - noted burning pain RUQ of abd -regardless of what she ate  Then she had much more frequent bm- now avg about 3 per day - not very loose but containing mucous  A little brb one time in mucous  Getting a bit better overall   Still  burning every time she eats  Has IUD-not pregnant   No more fever   ? If this was a virus  Has hx of tiny hepatic cysts   Patient Active Problem List  Diagnosis  . DYSMENORRHEA  . SLEEP DISORDER  . Anxiety and depression  . Hematuria, microscopic  . Dysuria  . Hepatic lesion  . Abdominal pain, lower  . Joint pain  . Myofascial pain  . Pain of right lower leg  . Routine general medical examination at a health care facility  . Routine gynecological examination   Past Medical History  Diagnosis Date  . Malignant melanoma     left arm- wide excision   Past Surgical History  Procedure Laterality Date  . Breast enhancement surgery     History  Substance Use Topics  . Smoking status: Never Smoker   . Smokeless tobacco: Not on file  . Alcohol Use: No   Family History  Problem Relation Age of Onset  . Coronary artery disease Maternal Grandfather   . Hypertension Paternal Grandfather   . Nephrolithiasis Mother    Allergies  Allergen Reactions  . Penicillins     REACTION: As a small child does not remember exact reaction.   Current Outpatient Prescriptions on File Prior to Visit  Medication Sig Dispense Refill  . IUD'S IU by Intrauterine route.        . Multiple Vitamin (MULTIVITAMIN) capsule Take 1 capsule by mouth daily.       No current facility-administered medications on file prior to visit.       Review of Systems Review of Systems  Constitutional: Negative for fever, appetite change, and unexpected weight change.  ENT neg for ST Eyes: Negative for pain and visual disturbance.  Respiratory: Negative for cough and shortness of breath.   Cardiovascular: Negative for cp or palpitations    Gastrointestinal: Negative for blood in stool/ dark stool or rectal pain  Genitourinary: Negative for urgency and frequency. neg for hematuria Skin: Negative for pallor or rash   Neurological: Negative for weakness, light-headedness, numbness and headaches.  Hematological: Negative for adenopathy. Does not bruise/bleed easily.  Psychiatric/Behavioral: Negative for dysphoric mood. The patient is not nervous/anxious.         Objective:   Physical Exam  Constitutional: She appears well-developed and well-nourished. No distress.  HENT:  Head: Normocephalic and atraumatic.  Mouth/Throat: Oropharynx is clear and moist.  Eyes: Conjunctivae and EOM are normal. Pupils are equal, round, and reactive to light. No scleral icterus.  Neck: Normal range of motion. Neck supple. No JVD present. Carotid bruit is not present. Erythema present. No thyromegaly present.  Cardiovascular: Normal rate and regular rhythm.   Pulmonary/Chest: Effort normal and breath sounds normal. No respiratory distress. She has no wheezes.  Abdominal: Soft. Bowel sounds are normal.  She exhibits no distension, no abdominal bruit and no mass. There is no hepatosplenomegaly. There is tenderness in the epigastric area and left upper quadrant. There is no rigidity, no rebound, no guarding, no tenderness at McBurney's point and negative Murphy's sign.  Musculoskeletal: She exhibits no edema.  Lymphadenopathy:    She has no cervical adenopathy.  Neurological: She is alert. She has normal reflexes.  Skin: Skin is warm and dry. No rash noted. No erythema. No pallor.  Brisk capillary refill  Psychiatric: She has a normal mood and affect.           Assessment & Plan:

## 2012-11-18 NOTE — Patient Instructions (Addendum)
Take zantac 150 mg twice daily - for 2-4 weeks  Get align (probiotic) otc and take as directed for 2 weeks Lab today  Keep fluids up  [place]  If not significanly better- we would consider GI consult- so let me know

## 2012-11-19 ENCOUNTER — Encounter: Payer: Self-pay | Admitting: *Deleted

## 2012-11-19 LAB — CBC WITH DIFFERENTIAL
Basos: 0 % (ref 0–3)
Eos: 1 % (ref 0–5)
Hemoglobin: 12.6 g/dL (ref 11.1–15.9)
Immature Grans (Abs): 0 10*3/uL (ref 0.0–0.1)
Immature Granulocytes: 0 % (ref 0–2)
Lymphocytes Absolute: 1.6 10*3/uL (ref 0.7–3.1)
MCH: 29.4 pg (ref 26.6–33.0)
MCV: 86 fL (ref 79–97)
Monocytes Absolute: 0.7 10*3/uL (ref 0.1–0.9)
Monocytes: 11 % (ref 4–12)
Platelets: 388 10*3/uL — ABNORMAL HIGH (ref 155–379)
RBC: 4.28 x10E6/uL (ref 3.77–5.28)
WBC: 6.7 10*3/uL (ref 3.4–10.8)

## 2012-11-19 LAB — HEPATIC FUNCTION PANEL
Albumin: 4.2 g/dL (ref 3.5–5.5)
Total Bilirubin: 0.2 mg/dL (ref 0.0–1.2)
Total Protein: 6.7 g/dL (ref 6.0–8.5)

## 2012-11-19 LAB — BASIC METABOLIC PANEL
BUN: 9 mg/dL (ref 6–20)
CO2: 26 mmol/L (ref 19–28)
Calcium: 9.6 mg/dL (ref 8.7–10.2)
Creatinine, Ser: 0.6 mg/dL (ref 0.57–1.00)
GFR calc non Af Amer: 122 mL/min/{1.73_m2} (ref >59)

## 2013-07-22 ENCOUNTER — Encounter: Payer: Self-pay | Admitting: Family Medicine

## 2013-07-22 ENCOUNTER — Ambulatory Visit (INDEPENDENT_AMBULATORY_CARE_PROVIDER_SITE_OTHER): Payer: 59 | Admitting: Family Medicine

## 2013-07-22 VITALS — BP 106/68 | HR 74 | Temp 98.6°F | Ht 64.25 in | Wt 147.0 lb

## 2013-07-22 DIAGNOSIS — R6889 Other general symptoms and signs: Secondary | ICD-10-CM

## 2013-07-22 DIAGNOSIS — M792 Neuralgia and neuritis, unspecified: Secondary | ICD-10-CM | POA: Insufficient documentation

## 2013-07-22 DIAGNOSIS — G5690 Unspecified mononeuropathy of unspecified upper limb: Secondary | ICD-10-CM

## 2013-07-22 NOTE — Patient Instructions (Signed)
Labs today  Use cool compresses if helpful  Will call with results

## 2013-07-22 NOTE — Assessment & Plan Note (Addendum)
Pt complains of hot feeling in hands and feet -so bad at night it requires hot packs ? Neuropathy Strange constellation of symptoms  No hot flashes  Lab today

## 2013-07-22 NOTE — Progress Notes (Signed)
Subjective:    Patient ID: Caitlin Ward, female    DOB: 03-10-1981, 32 y.o.   MRN: [id number]  HPI Here for symptom of "hot" hands and feet  Happens in evening and night  No sweating  Has to go to bed with an ice pack on hands and feet  ? Hormonal cause  Hands get red and hot - very uncomfortable / almost burning   She googled this  Read about B complex def - and she began a supplement that has not helped   Has IUD - 5 years / mirena - occ spotting/ does not normally have a peroid   No early menopause in family   Patient Active Problem List   Diagnosis Date Noted  . Abdominal pain, right upper quadrant 11/18/2012  . Routine gynecological examination 07/30/2012  . Routine general medical examination at a health care facility 07/21/2012  . Joint pain 05/28/2012  . Myofascial pain 05/28/2012  . Pain of right lower leg 05/28/2012  . Abdominal pain, lower 03/29/2012  . Hepatic lesion 03/07/2012  . Dysuria 11/03/2011  . Hematuria, microscopic 10/23/2011  . Anxiety and depression 03/28/2011  . SLEEP DISORDER 04/27/2010  . DYSMENORRHEA 02/27/2007   Past Medical History  Diagnosis Date  . Malignant melanoma     left arm- wide excision   Past Surgical History  Procedure Laterality Date  . Breast enhancement surgery     History  Substance Use Topics  . Smoking status: Never Smoker   . Smokeless tobacco: Not on file  . Alcohol Use: No   Family History  Problem Relation Age of Onset  . Coronary artery disease Maternal Grandfather   . Hypertension Paternal Grandfather   . Nephrolithiasis Mother    Allergies  Allergen Reactions  . Penicillins     REACTION: As a small child does not remember exact reaction.   Current Outpatient Prescriptions on File Prior to Visit  Medication Sig Dispense Refill  . IUD'S IU by Intrauterine route.        . Multiple Vitamin (MULTIVITAMIN) capsule Take 1 capsule by mouth daily.       No current facility-administered medications  on file prior to visit.      Review of Systems    Review of Systems  Constitutional: Negative for fever, appetite change, fatigue and unexpected weight change.  Eyes: Negative for pain and visual disturbance.  Respiratory: Negative for cough and shortness of breath.   Cardiovascular: Negative for cp or palpitations    Gastrointestinal: Negative for nausea, diarrhea and constipation.  Genitourinary: Negative for urgency and frequency.  Skin: Negative for pallor or rash   Neurological: Negative for weakness, light-headedness, numbness and headaches.  Hematological: Negative for adenopathy. Does not bruise/bleed easily.  Psychiatric/Behavioral: Negative for dysphoric mood. The patient is not nervous/anxious.      Objective:   Physical Exam  Constitutional: She appears well-developed and well-nourished. No distress.  HENT:  Head: Normocephalic and atraumatic.  Mouth/Throat: Oropharynx is clear and moist.  Eyes: Conjunctivae and EOM are normal. Pupils are equal, round, and reactive to light. Right eye exhibits no discharge. Left eye exhibits no discharge. No scleral icterus.  Neck: Normal range of motion. Neck supple. Carotid bruit is not present. No thyromegaly present.  Cardiovascular: Normal rate, regular rhythm, normal heart sounds and intact distal pulses.   No murmur heard. Pulmonary/Chest: Effort normal and breath sounds normal. No respiratory distress. She has no wheezes. She has no rales.  Musculoskeletal: Normal range of  motion. She exhibits no edema and no tenderness.  No swelling of joints or extremeties No varicosities   Lymphadenopathy:    She has no cervical adenopathy.  Neurological: She is alert. She has normal strength and normal reflexes. No sensory deficit. She exhibits normal muscle tone.  Skin: Skin is warm and dry. No rash noted. No erythema. No pallor.  Psychiatric: She has a normal mood and affect.          Assessment & Plan:

## 2013-07-22 NOTE — Assessment & Plan Note (Signed)
Of hands /feet -not universal ? Poss manefestation of neuropathy Lab today  Nl exam

## 2013-07-22 NOTE — Progress Notes (Signed)
Pre-visit discussion using our clinic review tool. No additional management support is needed unless otherwise documented below in the visit note.  

## 2013-07-23 LAB — CBC WITH DIFFERENTIAL/PLATELET
Basophils Absolute: 0 10*3/uL (ref 0.0–0.2)
Basos: 0 %
Eos: 4 %
HCT: 36.2 % (ref 34.0–46.6)
Hemoglobin: 12.6 g/dL (ref 11.1–15.9)
Lymphocytes Absolute: 1.8 10*3/uL (ref 0.7–3.1)
Lymphs: 33 %
Monocytes: 7 %
Neutrophils Absolute: 3 10*3/uL (ref 1.4–7.0)
RBC: 4.21 x10E6/uL (ref 3.77–5.28)
WBC: 5.4 10*3/uL (ref 3.4–10.8)

## 2013-07-23 LAB — SEDIMENTATION RATE: Sed Rate: 3 mm/hr (ref 0–32)

## 2013-07-23 LAB — COMPREHENSIVE METABOLIC PANEL
Albumin: 4.3 g/dL (ref 3.5–5.5)
BUN: 9 mg/dL (ref 6–20)
Chloride: 99 mmol/L (ref 97–108)
Creatinine, Ser: 0.61 mg/dL (ref 0.57–1.00)
GFR calc Af Amer: 140 mL/min/{1.73_m2} (ref >59)
GFR calc non Af Amer: 121 mL/min/{1.73_m2} (ref >59)
Globulin, Total: 2.2 g/dL (ref 1.5–4.5)
Glucose: 88 mg/dL (ref 65–99)
Total Bilirubin: 0.4 mg/dL (ref 0.0–1.2)
Total Protein: 6.5 g/dL (ref 6.0–8.5)

## 2013-07-23 LAB — VITAMIN B12: Vitamin B-12: 281 pg/mL (ref 211–946)

## 2013-07-23 LAB — TSH: TSH: 2.23 u[IU]/mL (ref 0.450–4.500)

## 2013-07-25 ENCOUNTER — Encounter: Payer: Self-pay | Admitting: *Deleted

## 2013-07-28 ENCOUNTER — Telehealth: Payer: Self-pay

## 2013-07-28 NOTE — Telephone Encounter (Signed)
Pt requesting lab results of 07/22/13; pt advised as noted in result notes. Pt voiced understanding.

## 2013-09-07 ENCOUNTER — Telehealth: Payer: Self-pay | Admitting: Family Medicine

## 2013-09-07 DIAGNOSIS — Z0000 Encounter for general adult medical examination without abnormal findings: Secondary | ICD-10-CM

## 2013-09-07 NOTE — Telephone Encounter (Signed)
Message copied by Abner Greenspan on Sun Sep 07, 2013  4:09 PM ------      Message from: Ellamae Sia      Created: Wed Sep 03, 2013  5:50 PM      Regarding: Lab orders for Monday, 2.9.15       Patient is scheduled for CPX labs, please order future labs, Thanks , Terri       ------

## 2013-09-09 ENCOUNTER — Other Ambulatory Visit (INDEPENDENT_AMBULATORY_CARE_PROVIDER_SITE_OTHER): Payer: 59

## 2013-09-09 DIAGNOSIS — Z0000 Encounter for general adult medical examination without abnormal findings: Secondary | ICD-10-CM

## 2013-09-10 LAB — COMPREHENSIVE METABOLIC PANEL
ALBUMIN: 4.1 g/dL (ref 3.5–5.5)
ALT: 10 IU/L (ref 0–32)
AST: 14 IU/L (ref 0–40)
Albumin/Globulin Ratio: 1.6 (ref 1.1–2.5)
Alkaline Phosphatase: 44 IU/L (ref 39–117)
BUN / CREAT RATIO: 15 (ref 8–20)
BUN: 10 mg/dL (ref 6–20)
CALCIUM: 9.3 mg/dL (ref 8.7–10.2)
CHLORIDE: 102 mmol/L (ref 97–108)
CO2: 23 mmol/L (ref 18–29)
CREATININE: 0.65 mg/dL (ref 0.57–1.00)
GFR calc Af Amer: 137 mL/min/{1.73_m2} (ref >59)
GFR calc non Af Amer: 119 mL/min/{1.73_m2} (ref >59)
GLOBULIN, TOTAL: 2.6 g/dL (ref 1.5–4.5)
GLUCOSE: 107 mg/dL — AB (ref 65–99)
Potassium: 5.3 mmol/L — ABNORMAL HIGH (ref 3.5–5.2)
Sodium: 140 mmol/L (ref 134–144)
Total Bilirubin: 0.4 mg/dL (ref 0.0–1.2)
Total Protein: 6.7 g/dL (ref 6.0–8.5)

## 2013-09-10 LAB — LIPID PANEL
CHOL/HDL RATIO: 5.1 ratio — AB (ref 0.0–4.4)
CHOLESTEROL TOTAL: 195 mg/dL (ref 100–199)
HDL: 38 mg/dL — ABNORMAL LOW (ref >39)
LDL Calculated: 136 mg/dL — ABNORMAL HIGH (ref 0–99)
Triglycerides: 104 mg/dL (ref 0–149)
VLDL CHOLESTEROL CAL: 21 mg/dL (ref 5–40)

## 2013-09-10 LAB — CBC WITH DIFFERENTIAL/PLATELET
BASOS: 0 %
Basophils Absolute: 0 10*3/uL (ref 0.0–0.2)
EOS: 4 %
Eosinophils Absolute: 0.3 10*3/uL (ref 0.0–0.4)
HEMATOCRIT: 35 % (ref 34.0–46.6)
Hemoglobin: 12.2 g/dL (ref 11.1–15.9)
IMMATURE GRANS (ABS): 0 10*3/uL (ref 0.0–0.1)
IMMATURE GRANULOCYTES: 0 %
LYMPHS ABS: 2 10*3/uL (ref 0.7–3.1)
Lymphs: 30 %
MCH: 29.9 pg (ref 26.6–33.0)
MCHC: 34.9 g/dL (ref 31.5–35.7)
MCV: 86 fL (ref 79–97)
Monocytes Absolute: 0.5 10*3/uL (ref 0.1–0.9)
Monocytes: 7 %
NEUTROS PCT: 59 %
Neutrophils Absolute: 4 10*3/uL (ref 1.4–7.0)
RBC: 4.08 x10E6/uL (ref 3.77–5.28)
RDW: 13.3 % (ref 12.3–15.4)
WBC: 6.7 10*3/uL (ref 3.4–10.8)

## 2013-09-10 LAB — TSH: TSH: 1.86 u[IU]/mL (ref 0.450–4.500)

## 2013-09-16 ENCOUNTER — Encounter: Payer: 59 | Admitting: Family Medicine

## 2013-09-19 ENCOUNTER — Other Ambulatory Visit (HOSPITAL_COMMUNITY)
Admission: RE | Admit: 2013-09-19 | Discharge: 2013-09-19 | Disposition: A | Discharge status: Home / Self Care | Payer: 59 | Source: Ambulatory Visit | Attending: Family Medicine | Admitting: Family Medicine

## 2013-09-19 ENCOUNTER — Ambulatory Visit (INDEPENDENT_AMBULATORY_CARE_PROVIDER_SITE_OTHER): Payer: 59 | Admitting: Family Medicine

## 2013-09-19 ENCOUNTER — Encounter: Payer: Self-pay | Admitting: Family Medicine

## 2013-09-19 VITALS — BP 104/66 | HR 61 | Temp 98.2°F | Ht 64.0 in | Wt 145.5 lb

## 2013-09-19 DIAGNOSIS — E785 Hyperlipidemia, unspecified: Secondary | ICD-10-CM | POA: Insufficient documentation

## 2013-09-19 DIAGNOSIS — Z01419 Encounter for gynecological examination (general) (routine) without abnormal findings: Secondary | ICD-10-CM | POA: Insufficient documentation

## 2013-09-19 DIAGNOSIS — Z0000 Encounter for general adult medical examination without abnormal findings: Secondary | ICD-10-CM

## 2013-09-19 NOTE — Progress Notes (Signed)
Pre visit review using our clinic review tool, if applicable. No additional management support is needed unless otherwise documented below in the visit note. 

## 2013-09-19 NOTE — Patient Instructions (Signed)
Take care of yourself  Cut back on sugar and fat  Avoid red meat/ fried foods/ egg yolks/ fatty breakfast meats/ butter, cheese and high fat dairy/ and shellfish   If your back/ hip continue to bother you - call for an appt with Dr Lorelei Pont   Fat and Cholesterol Control Diet Fat and cholesterol levels in your blood and organs are influenced by your diet. High levels of fat and cholesterol may lead to diseases of the heart, small and large blood vessels, gallbladder, liver, and pancreas. CONTROLLING FAT AND CHOLESTEROL WITH DIET Although exercise and lifestyle factors are important, your diet is key. That is because certain foods are known to raise cholesterol and others to lower it. The goal is to balance foods for their effect on cholesterol and more importantly, to replace saturated and trans fat with other types of fat, such as monounsaturated fat, polyunsaturated fat, and omega-3 fatty acids. On average, a person should consume no more than 15 to 17 g of saturated fat daily. Saturated and trans fats are considered "bad" fats, and they will raise LDL cholesterol. Saturated fats are primarily found in animal products such as meats, butter, and cream. However, that does not mean you need to give up all your favorite foods. Today, there are good tasting, low-fat, low-cholesterol substitutes for most of the things you like to eat. Choose low-fat or nonfat alternatives. Choose round or loin cuts of red meat. These types of cuts are lowest in fat and cholesterol. Chicken (without the skin), fish, veal, and ground [place] breast are great choices. Eliminate fatty meats, such as hot dogs and salami. Even shellfish have little or no saturated fat. Have a 3 oz (85 g) portion when you eat lean meat, poultry, or fish. Trans fats are also called "partially hydrogenated oils." They are oils that have been scientifically manipulated so that they are solid at room temperature resulting in a longer shelf life and improved  taste and texture of foods in which they are added. Trans fats are found in stick margarine, some tub margarines, cookies, crackers, and baked goods.  When baking and cooking, oils are a great substitute for butter. The monounsaturated oils are especially beneficial since it is believed they lower LDL and raise HDL. The oils you should avoid entirely are saturated tropical oils, such as coconut and palm.  Remember to eat a lot from food groups that are naturally free of saturated and trans fat, including fish, fruit, vegetables, beans, grains (barley, rice, couscous, bulgur wheat), and pasta (without cream sauces).  IDENTIFYING FOODS THAT LOWER FAT AND CHOLESTEROL  Soluble fiber may lower your cholesterol. This type of fiber is found in fruits such as apples, vegetables such as broccoli, potatoes, and carrots, legumes such as beans, peas, and lentils, and grains such as barley. Foods fortified with plant sterols (phytosterol) may also lower cholesterol. You should eat at least 2 g per day of these foods for a cholesterol lowering effect.  Read package labels to identify low-saturated fats, trans fat free, and low-fat foods at the supermarket. Select cheeses that have only 2 to 3 g saturated fat per ounce. Use a heart-healthy tub margarine that is free of trans fats or partially hydrogenated oil. When buying baked goods (cookies, crackers), avoid partially hydrogenated oils. Breads and muffins should be made from whole grains (whole-wheat or whole oat flour, instead of "flour" or "enriched flour"). Buy non-creamy canned soups with reduced salt and no added fats.  FOOD PREPARATION TECHNIQUES  Never deep-fry. If you must fry, either stir-fry, which uses very little fat, or use non-stick cooking sprays. When possible, broil, bake, or roast meats, and steam vegetables. Instead of putting butter or margarine on vegetables, use lemon and herbs, applesauce, and cinnamon (for squash and sweet potatoes). Use nonfat  yogurt, salsa, and low-fat dressings for salads.  LOW-SATURATED FAT / LOW-FAT FOOD SUBSTITUTES Meats / Saturated Fat (g)  Avoid: Steak, marbled (3 oz/85 g) / 11 g  Choose: Steak, lean (3 oz/85 g) / 4 g  Avoid: Hamburger (3 oz/85 g) / 7 g  Choose: Hamburger, lean (3 oz/85 g) / 5 g  Avoid: Ham (3 oz/85 g) / 6 g  Choose: Ham, lean cut (3 oz/85 g) / 2.4 g  Avoid: Chicken, with skin, dark meat (3 oz/85 g) / 4 g  Choose: Chicken, skin removed, dark meat (3 oz/85 g) / 2 g  Avoid: Chicken, with skin, light meat (3 oz/85 g) / 2.5 g  Choose: Chicken, skin removed, light meat (3 oz/85 g) / 1 g Dairy / Saturated Fat (g)  Avoid: Whole milk (1 cup) / 5 g  Choose: Low-fat milk, 2% (1 cup) / 3 g  Choose: Low-fat milk, 1% (1 cup) / 1.5 g  Choose: Skim milk (1 cup) / 0.3 g  Avoid: Hard cheese (1 oz/28 g) / 6 g  Choose: Skim milk cheese (1 oz/28 g) / 2 to 3 g  Avoid: Cottage cheese, 4% fat (1 cup) / 6.5 g  Choose: Low-fat cottage cheese, 1% fat (1 cup) / 1.5 g  Avoid: Ice cream (1 cup) / 9 g  Choose: Sherbet (1 cup) / 2.5 g  Choose: Nonfat frozen yogurt (1 cup) / 0.3 g  Choose: Frozen fruit bar / trace  Avoid: Whipped cream (1 tbs) / 3.5 g  Choose: Nondairy whipped topping (1 tbs) / 1 g Condiments / Saturated Fat (g)  Avoid: Mayonnaise (1 tbs) / 2 g  Choose: Low-fat mayonnaise (1 tbs) / 1 g  Avoid: Butter (1 tbs) / 7 g  Choose: Extra light margarine (1 tbs) / 1 g  Avoid: Coconut oil (1 tbs) / 11.8 g  Choose: Olive oil (1 tbs) / 1.8 g  Choose: Corn oil (1 tbs) / 1.7 g  Choose: Safflower oil (1 tbs) / 1.2 g  Choose: Sunflower oil (1 tbs) / 1.4 g  Choose: Soybean oil (1 tbs) / 2.4 g  Choose: Canola oil (1 tbs) / 1 g Document Released: 07/17/2005 Document Revised: 11/11/2012 Document Reviewed: 01/05/2011 Exit[employer] Patient Information 2014 [place], [place].

## 2013-09-19 NOTE — Progress Notes (Signed)
Subjective:    Patient ID: Caitlin Ward, female    DOB: 01-16-1981, 33 y.o.   MRN: [id number]  HPI Here for health maintenance exam and to review chronic medical problems    Is doing well overall  She did start B vitamin - makes her a bit nauseated (even with food )  Still has hand and feet -hot at night   Flu vaccine - missed hers this season   Td was 6/11 up to date   Pap nl 12/13 - she is not going to gyn  Menses-none with IUD  Has IUD -this is year 5 , and will get that changed out - after she has had her pap  Does not want children   Labs: [facility] K was 5.3 - upper end of spectrum (takes mvi)  Gets a lot of K in her diet   Glucose 107- was fasting  She is a big sweet eater -will limit that   Still running for exercise -- but not for a month  (hurt her hip after the half marathon a mo ago)  Pain in lateral low back/ buttock -without radiation  Has had it before and it got better  Is doing stretches - and has not tried heat    Lab Results  Component Value Date   CHOL 181 07/22/2012   CHOL 187 01/20/2010   Lab Results  Component Value Date   HDL 38* 09/09/2013   HDL 36.20* 07/22/2012   HDL 42.50 01/20/2010   Lab Results  Component Value Date   LDLCALC 136* 09/09/2013   LDLCALC 120* 07/22/2012   LDLCALC 118* 01/20/2010   Lab Results  Component Value Date   TRIG 104 09/09/2013   TRIG 125.0 07/22/2012   TRIG 135.0 01/20/2010   Lab Results  Component Value Date   CHOLHDL 5.1* 09/09/2013   CHOLHDL 5 07/22/2012   CHOLHDL 4 01/20/2010   No results found for this basename: LDLDIRECT   she eats pretty healthy- fried foods occ/ and is addicted to sweets   Lab Results  Component Value Date   WBC 6.7 09/09/2013   HGB 12.2 09/09/2013   HCT 35.0 09/09/2013   MCV 86 09/09/2013   PLT 388* 11/18/2012      Chemistry      Component Value Date/Time   NA 140 09/09/2013 0918   NA 136 07/22/2012 0842   K 5.3* 09/09/2013 0918   CL 102 09/09/2013 0918   CO2 23  09/09/2013 0918   BUN 10 09/09/2013 0918   BUN 12 07/22/2012 0842   CREATININE 0.65 09/09/2013 0918      Component Value Date/Time   CALCIUM 9.3 09/09/2013 0918   ALKPHOS 44 09/09/2013 0918   AST 14 09/09/2013 0918   ALT 10 09/09/2013 0918   BILITOT 0.4 09/09/2013 0918      Lab Results  Component Value Date   TSH 1.860 09/09/2013     Patient Active Problem List   Diagnosis Date Noted  . Hyperlipidemia, mild 09/19/2013  . Neuropathic pain of hand 07/22/2013  . Sensation of feeling hot 07/22/2013  . Abdominal pain, right upper quadrant 11/18/2012  . Routine gynecological examination 07/30/2012  . Routine general medical examination at a health care facility 07/21/2012  . Joint pain 05/28/2012  . Myofascial pain 05/28/2012  . Pain of right lower leg 05/28/2012  . Abdominal pain, lower 03/29/2012  . Hepatic lesion 03/07/2012  . Dysuria 11/03/2011  . Hematuria, microscopic 10/23/2011  . Anxiety and  depression 03/28/2011  . SLEEP DISORDER 04/27/2010  . DYSMENORRHEA 02/27/2007   Past Medical History  Diagnosis Date  . Malignant melanoma     left arm- wide excision   Past Surgical History  Procedure Laterality Date  . Breast enhancement surgery     History  Substance Use Topics  . Smoking status: Never Smoker   . Smokeless tobacco: Not on file  . Alcohol Use: No   Family History  Problem Relation Age of Onset  . Coronary artery disease Maternal Grandfather   . Hypertension Paternal Grandfather   . Nephrolithiasis Mother    Allergies  Allergen Reactions  . Penicillins     REACTION: As a small child does not remember exact reaction.   Current Outpatient Prescriptions on File Prior to Visit  Medication Sig Dispense Refill  . IUD'S IU by Intrauterine route.        . Multiple Vitamin (MULTIVITAMIN) capsule Take 1 capsule by mouth daily.       No current facility-administered medications on file prior to visit.    Review of Systems    Review of Systems    Constitutional: Negative for fever, appetite change, fatigue and unexpected weight change.  Eyes: Negative for pain and visual disturbance.  Respiratory: Negative for cough and shortness of breath.   Cardiovascular: Negative for cp or palpitations    Gastrointestinal: Negative for nausea, diarrhea and constipation.  Genitourinary: Negative for urgency and frequency.  Skin: Negative for pallor or rash  pos for warmth of hands and feet at night without redness or rash MSK pos for pain in R buttock area with running Neurological: Negative for weakness, light-headedness, numbness and headaches.  Hematological: Negative for adenopathy. Does not bruise/bleed easily.  Psychiatric/Behavioral: Negative for dysphoric mood. The patient is not nervous/anxious.      Objective:   Physical Exam  Nursing note and vitals reviewed. Constitutional: She appears well-developed and well-nourished. No distress.  HENT:  Head: Normocephalic and atraumatic.  Right Ear: External ear normal.  Left Ear: External ear normal.  Nose: Nose normal.  Mouth/Throat: Oropharynx is clear and moist.  Eyes: Conjunctivae and EOM are normal. Pupils are equal, round, and reactive to light. Right eye exhibits no discharge. Left eye exhibits no discharge. No scleral icterus.  Neck: Normal range of motion. Neck supple. No JVD present. No thyromegaly present.  Cardiovascular: Normal rate, regular rhythm, normal heart sounds and intact distal pulses.  Exam reveals no gallop.   Pulmonary/Chest: Effort normal and breath sounds normal. No respiratory distress. She has no wheezes. She has no rales.  Abdominal: Soft. Bowel sounds are normal. She exhibits no distension and no mass. There is tenderness.  Very slt tenderness RLQ without rebound or guarding/ neg bimanual pelvic exam  Genitourinary: No breast swelling, tenderness, discharge or bleeding. There is no rash, tenderness or lesion on the right labia. There is no rash, tenderness or  lesion on the left labia. Uterus is not enlarged and not tender. Cervix exhibits no motion tenderness, no discharge and no friability. Right adnexum displays no mass, no tenderness and no fullness. Left adnexum displays no mass, no tenderness and no fullness. No erythema or bleeding around the vagina.  Breast exam: No mass, nodules, thickening, tenderness, bulging, retraction, inflamation, nipple discharge or skin changes noted.  No axillary or clavicular LA.  Chaperoned exam.  (implants noted and stable)  IUD string visible from OS   Musculoskeletal: She exhibits no edema and no tenderness.  Lymphadenopathy:    She  has no cervical adenopathy.  Neurological: She is alert. She has normal reflexes. No cranial nerve deficit. She exhibits normal muscle tone. Coordination normal.  Skin: Skin is warm and dry. No rash noted. No erythema. No pallor.  Psychiatric: She has a normal mood and affect.  Cheerful            Assessment & Plan:

## 2013-09-21 NOTE — Assessment & Plan Note (Signed)
Disc goals for lipids and reasons to control them Rev labs with pt Rev low sat fat diet in detail  Handout given for low cholesterol diet

## 2013-09-21 NOTE — Assessment & Plan Note (Signed)
Annual exam with pap Pt will have IUD changed with gyn soon  Doing well with no complaints

## 2013-09-24 ENCOUNTER — Encounter: Payer: Self-pay | Admitting: *Deleted

## 2014-04-10 ENCOUNTER — Ambulatory Visit: Payer: 59 | Admitting: Family Medicine

## 2014-09-22 ENCOUNTER — Telehealth: Payer: Self-pay | Admitting: Family Medicine

## 2014-09-22 DIAGNOSIS — Z0000 Encounter for general adult medical examination without abnormal findings: Secondary | ICD-10-CM

## 2014-09-22 NOTE — Telephone Encounter (Signed)
-----   Message from Ellamae Sia sent at 09/16/2014  6:08 PM EST ----- Regarding: lab orders for Wednesday, 2.24.16 Patient is scheduled for CPX labs, please order future labs, Thanks , Karna Christmas

## 2014-09-23 ENCOUNTER — Other Ambulatory Visit (INDEPENDENT_AMBULATORY_CARE_PROVIDER_SITE_OTHER): Payer: 59

## 2014-09-23 DIAGNOSIS — Z0000 Encounter for general adult medical examination without abnormal findings: Secondary | ICD-10-CM

## 2014-09-24 LAB — COMPREHENSIVE METABOLIC PANEL
ALT: 11 IU/L (ref 0–32)
AST: 16 IU/L (ref 0–40)
Albumin/Globulin Ratio: 1.8 (ref 1.1–2.5)
Albumin: 4.2 g/dL (ref 3.5–5.5)
Alkaline Phosphatase: 51 IU/L (ref 39–117)
BILIRUBIN TOTAL: 0.3 mg/dL (ref 0.0–1.2)
BUN/Creatinine Ratio: 14 (ref 8–20)
BUN: 10 mg/dL (ref 6–20)
CALCIUM: 9.4 mg/dL (ref 8.7–10.2)
CHLORIDE: 102 mmol/L (ref 97–108)
CO2: 25 mmol/L (ref 18–29)
Creatinine, Ser: 0.74 mg/dL (ref 0.57–1.00)
GFR calc Af Amer: 124 mL/min/{1.73_m2} (ref >59)
GFR, EST NON AFRICAN AMERICAN: 108 mL/min/{1.73_m2} (ref >59)
Globulin, Total: 2.3 g/dL (ref 1.5–4.5)
Glucose: 85 mg/dL (ref 65–99)
Potassium: 4.9 mmol/L (ref 3.5–5.2)
SODIUM: 142 mmol/L (ref 134–144)
TOTAL PROTEIN: 6.5 g/dL (ref 6.0–8.5)

## 2014-09-24 LAB — CBC WITH DIFFERENTIAL/PLATELET
BASOS ABS: 0 10*3/uL (ref 0.0–0.2)
Basos: 0 %
EOS: 3 %
Eosinophils Absolute: 0.2 10*3/uL (ref 0.0–0.4)
HEMATOCRIT: 36.2 % (ref 34.0–46.6)
Hemoglobin: 12.1 g/dL (ref 11.1–15.9)
IMMATURE GRANULOCYTES: 0 %
Immature Grans (Abs): 0 10*3/uL (ref 0.0–0.1)
Lymphocytes Absolute: 1.7 10*3/uL (ref 0.7–3.1)
Lymphs: 29 %
MCH: 29.4 pg (ref 26.6–33.0)
MCHC: 33.4 g/dL (ref 31.5–35.7)
MCV: 88 fL (ref 79–97)
MONOCYTES: 9 %
Monocytes Absolute: 0.5 10*3/uL (ref 0.1–0.9)
Neutrophils Absolute: 3.5 10*3/uL (ref 1.4–7.0)
Neutrophils Relative %: 59 %
PLATELETS: 327 10*3/uL (ref 150–379)
RBC: 4.11 x10E6/uL (ref 3.77–5.28)
RDW: 13.2 % (ref 12.3–15.4)
WBC: 5.9 10*3/uL (ref 3.4–10.8)

## 2014-09-24 LAB — LIPID PANEL
CHOL/HDL RATIO: 4.7 ratio — AB (ref 0.0–4.4)
Cholesterol, Total: 184 mg/dL (ref 100–199)
HDL: 39 mg/dL — AB (ref >39)
LDL Calculated: 114 mg/dL — ABNORMAL HIGH (ref 0–99)
Triglycerides: 154 mg/dL — ABNORMAL HIGH (ref 0–149)
VLDL CHOLESTEROL CAL: 31 mg/dL (ref 5–40)

## 2014-09-24 LAB — TSH: TSH: 2.06 u[IU]/mL (ref 0.450–4.500)

## 2014-09-28 ENCOUNTER — Ambulatory Visit (INDEPENDENT_AMBULATORY_CARE_PROVIDER_SITE_OTHER): Payer: 59 | Admitting: Family Medicine

## 2014-09-28 ENCOUNTER — Encounter: Payer: Self-pay | Admitting: Family Medicine

## 2014-09-28 ENCOUNTER — Other Ambulatory Visit (HOSPITAL_COMMUNITY)
Admission: RE | Admit: 2014-09-28 | Discharge: 2014-09-28 | Disposition: A | Discharge status: Home / Self Care | Payer: 59 | Source: Ambulatory Visit | Attending: Family Medicine | Admitting: Family Medicine

## 2014-09-28 VITALS — BP 110/74 | HR 64 | Temp 98.1°F | Ht 64.0 in | Wt 148.4 lb

## 2014-09-28 DIAGNOSIS — R6889 Other general symptoms and signs: Secondary | ICD-10-CM | POA: Diagnosis not present

## 2014-09-28 DIAGNOSIS — E785 Hyperlipidemia, unspecified: Secondary | ICD-10-CM

## 2014-09-28 DIAGNOSIS — Z01411 Encounter for gynecological examination (general) (routine) with abnormal findings: Secondary | ICD-10-CM | POA: Diagnosis present

## 2014-09-28 DIAGNOSIS — Z0000 Encounter for general adult medical examination without abnormal findings: Secondary | ICD-10-CM

## 2014-09-28 DIAGNOSIS — Z01419 Encounter for gynecological examination (general) (routine) without abnormal findings: Secondary | ICD-10-CM | POA: Diagnosis not present

## 2014-09-28 NOTE — Progress Notes (Signed)
Subjective:    Patient ID: Caitlin Ward, female    DOB: 1980/10/27, 34 y.o.   MRN: [id number]  HPI Here for health maintenance exam and to review chronic medical problems    Doing pretty well  Nothing new going  Taking care of herself - just started back exercising - running (goal for 1/2 marathon in the fall) Always struggles with healthy food    Wt is up 3 lb with bmi of 25  HIV screening = not interested   Flu shot did not get one and does not want one   Pap 2/15 normal - needs to do that  No periods -just got a new IUD last June - works well for her   Self breast exam - no lumps or changes   Tdap 6/11  Hx of melanoma - has appt f/u derm tomorrow- yearly (nothing new)   Still suffering from sens of heat in hands and feet  Severe at times Worse at night No n/t or stinging Sleeps with cold packs  Results for orders placed or performed in visit on 09/23/14  CBC with Differential/Platelet  Result Value Ref Range   WBC 5.9 3.4 - 10.8 x10E3/uL   RBC 4.11 3.77 - 5.28 x10E6/uL   Hemoglobin 12.1 11.1 - 15.9 g/dL   HCT 36.2 34.0 - 46.6 %   MCV 88 79 - 97 fL   MCH 29.4 26.6 - 33.0 pg   MCHC 33.4 31.5 - 35.7 g/dL   RDW 13.2 12.3 - 15.4 %   Platelets 327 150 - 379 x10E3/uL   Neutrophils Relative % 59 %   Lymphs 29 %   Monocytes 9 %   Eos 3 %   Basos 0 %   Neutrophils Absolute 3.5 1.4 - 7.0 x10E3/uL   Lymphocytes Absolute 1.7 0.7 - 3.1 x10E3/uL   Monocytes Absolute 0.5 0.1 - 0.9 x10E3/uL   Eosinophils Absolute 0.2 0.0 - 0.4 x10E3/uL   Basophils Absolute 0.0 0.0 - 0.2 x10E3/uL   Immature Granulocytes 0 %   Immature Grans (Abs) 0.0 0.0 - 0.1 x10E3/uL  Comprehensive metabolic panel  Result Value Ref Range   Glucose 85 65 - 99 mg/dL   BUN 10 6 - 20 mg/dL   Creatinine, Ser 0.74 0.57 - 1.00 mg/dL   GFR calc non Af Amer 108 >59 mL/min/1.73   GFR calc Af Amer 124 >59 mL/min/1.73   BUN/Creatinine Ratio 14 8 - 20   Sodium 142 134 - 144 mmol/L   Potassium 4.9 3.5  - 5.2 mmol/L   Chloride 102 97 - 108 mmol/L   CO2 25 18 - 29 mmol/L   Calcium 9.4 8.7 - 10.2 mg/dL   Total Protein 6.5 6.0 - 8.5 g/dL   Albumin 4.2 3.5 - 5.5 g/dL   Globulin, Total 2.3 1.5 - 4.5 g/dL   Albumin/Globulin Ratio 1.8 1.1 - 2.5   Bilirubin Total 0.3 0.0 - 1.2 mg/dL   Alkaline Phosphatase 51 39 - 117 IU/L   AST 16 0 - 40 IU/L   ALT 11 0 - 32 IU/L  Lipid panel  Result Value Ref Range   Cholesterol, Total 184 100 - 199 mg/dL   Triglycerides 154 (H) 0 - 149 mg/dL   HDL 39 (L) >39 mg/dL   VLDL Cholesterol Cal 31 5 - 40 mg/dL   LDL Calculated 114 (H) 0 - 99 mg/dL   Chol/HDL Ratio 4.7 (H) 0.0 - 4.4 ratio units  TSH  Result Value Ref Range  TSH 2.060 0.450 - 4.500 uIU/mL    Hx of hyperlipidemia Lab Results  Component Value Date   CHOL 184 09/23/2014   CHOL 195 09/09/2013   CHOL 181 07/22/2012   Lab Results  Component Value Date   HDL 39* 09/23/2014   HDL 38* 09/09/2013   HDL 36.20* 07/22/2012   Lab Results  Component Value Date   LDLCALC 114* 09/23/2014   LDLCALC 136* 09/09/2013   LDLCALC 120* 07/22/2012   Lab Results  Component Value Date   TRIG 154* 09/23/2014   TRIG 104 09/09/2013   TRIG 125.0 07/22/2012   Lab Results  Component Value Date   CHOLHDL 4.7* 09/23/2014   CHOLHDL 5.1* 09/09/2013   CHOLHDL 5 07/22/2012   No results found for: LDLDIRECT  Overall fairly stable  Running will help this    Patient Active Problem List   Diagnosis Date Noted  . Hyperlipidemia, mild 09/19/2013  . Neuropathic pain of hand 07/22/2013  . Sensation of feeling hot 07/22/2013  . Routine gynecological examination 07/30/2012  . Routine general medical examination at a health care facility 07/21/2012  . Joint pain 05/28/2012  . Myofascial pain 05/28/2012  . Pain of right lower leg 05/28/2012  . Hepatic lesion 03/07/2012  . Hematuria, microscopic 10/23/2011  . Anxiety and depression 03/28/2011  . SLEEP DISORDER 04/27/2010  . DYSMENORRHEA 02/27/2007   Past  Medical History  Diagnosis Date  . Malignant melanoma     left arm- wide excision   Past Surgical History  Procedure Laterality Date  . Breast enhancement surgery     History  Substance Use Topics  . Smoking status: Never Smoker   . Smokeless tobacco: Not on file  . Alcohol Use: No   Family History  Problem Relation Age of Onset  . Coronary artery disease Maternal Grandfather   . Hypertension Paternal Grandfather   . Nephrolithiasis Mother    Allergies  Allergen Reactions  . Penicillins     REACTION: As a small child does not remember exact reaction.   Current Outpatient Prescriptions on File Prior to Visit  Medication Sig Dispense Refill  . IUD'S IU by Intrauterine route.      . Multiple Vitamin (MULTIVITAMIN) capsule Take 1 capsule by mouth daily.     No current facility-administered medications on file prior to visit.    Review of Systems Review of Systems  Constitutional: Negative for fever, appetite change, fatigue and unexpected weight change.  Eyes: Negative for pain and visual disturbance.  Respiratory: Negative for cough and shortness of breath.   Cardiovascular: Negative for cp or palpitations   pos for very hot hands and feet- so bad at night she sleeps with ice packs  Gastrointestinal: Negative for nausea, diarrhea and constipation.  Genitourinary: Negative for urgency and frequency.  Skin: Negative for pallor or rash   Neurological: Negative for weakness, light-headedness, numbness and headaches.  Hematological: Negative for adenopathy. Does not bruise/bleed easily.  Psychiatric/Behavioral: Negative for dysphoric mood. The patient is not nervous/anxious.         Objective:   Physical Exam  Constitutional: She appears well-developed and well-nourished. No distress.  HENT:  Head: Normocephalic and atraumatic.  Right Ear: External ear normal.  Left Ear: External ear normal.  Nose: Nose normal.  Mouth/Throat: Oropharynx is clear and moist.  Eyes:  Conjunctivae and EOM are normal. Pupils are equal, round, and reactive to light. Right eye exhibits no discharge. Left eye exhibits no discharge. No scleral icterus.  Neck: Normal range  of motion. Neck supple. No JVD present. No thyromegaly present.  Cardiovascular: Normal rate, regular rhythm, normal heart sounds and intact distal pulses.  Exam reveals no gallop.   Pulmonary/Chest: Effort normal and breath sounds normal. No respiratory distress. She has no wheezes. She has no rales.  Abdominal: Soft. Bowel sounds are normal. She exhibits no distension and no mass. There is no tenderness.  Genitourinary: No breast swelling, tenderness, discharge or bleeding. There is no rash, tenderness or lesion on the right labia. There is no rash, tenderness or lesion on the left labia. Uterus is not enlarged and not tender. Cervix exhibits no motion tenderness, no discharge and no friability. Right adnexum displays no mass, no tenderness and no fullness. Left adnexum displays no mass, no tenderness and no fullness. No tenderness or bleeding in the vagina. No vaginal discharge found.  Breast exam: No mass, nodules, thickening, tenderness, bulging, retraction, inflamation, nipple discharge or skin changes noted.  No axillary or clavicular LA.    (breast implants noted)  Musculoskeletal: She exhibits no edema or tenderness.  Lymphadenopathy:    She has no cervical adenopathy.  Neurological: She is alert. She has normal reflexes. No cranial nerve deficit. She exhibits normal muscle tone. Coordination normal.  Skin: Skin is warm and dry. No rash noted. No erythema. No pallor.  Psychiatric: She has a normal mood and affect.          Assessment & Plan:   Problem List Items Addressed This Visit      Other   Encounter for routine gynecological examination    Exam and pap done  No c/o Has IUD -doing well with that  Declines std screening       Relevant Orders   Cytology - PAP   Hyperlipidemia, mild     Disc goals for lipids and reasons to control them Rev labs with pt Rev low sat fat diet in detail Overall slt improved LDL is below 130 HDL is 39- disc goal of over 40- plans to begin running again      Routine general medical examination at a health care facility - Primary    Reviewed health habits including diet and exercise and skin cancer prevention Reviewed appropriate screening tests for age  Also reviewed health mt list, fam hx and immunization status , as well as social and family history   See HPI Labs reviewed Declines flu vaccine       Temperature intolerance    See overview- hot hands and feet- severe at times No distinct features of neuropathy- ?if this is an unusual symptom  Nl pulses and exam  Ref to neuro         Relevant Orders   Ambulatory referral to Neurology

## 2014-09-28 NOTE — Assessment & Plan Note (Signed)
Disc goals for lipids and reasons to control them Rev labs with pt Rev low sat fat diet in detail Overall slt improved LDL is below 130 HDL is 39- disc goal of over 40- plans to begin running again

## 2014-09-28 NOTE — Assessment & Plan Note (Signed)
Reviewed health habits including diet and exercise and skin cancer prevention Reviewed appropriate screening tests for age  Also reviewed health mt list, fam hx and immunization status , as well as social and family history   See HPI Labs reviewed Declines flu vaccine

## 2014-09-28 NOTE — Assessment & Plan Note (Signed)
See overview- hot hands and feet- severe at times No distinct features of neuropathy- ?if this is an unusual symptom  Nl pulses and exam  Ref to neuro

## 2014-09-28 NOTE — Patient Instructions (Signed)
Stop at check out for referral to neurology  Take care of yourself  Labs look ok

## 2014-09-28 NOTE — Progress Notes (Signed)
Pre visit review using our clinic review tool, if applicable. No additional management support is needed unless otherwise documented below in the visit note. 

## 2014-09-28 NOTE — Assessment & Plan Note (Signed)
Exam and pap done  No c/o Has IUD -doing well with that  Declines std screening

## 2014-09-30 LAB — CYTOLOGY - PAP

## 2014-10-13 ENCOUNTER — Encounter: Payer: Self-pay | Admitting: Neurology

## 2014-10-13 ENCOUNTER — Ambulatory Visit (INDEPENDENT_AMBULATORY_CARE_PROVIDER_SITE_OTHER): Payer: 59 | Admitting: Neurology

## 2014-10-13 VITALS — BP 108/78 | HR 77 | Resp 16 | Ht 65.0 in | Wt 147.0 lb

## 2014-10-13 DIAGNOSIS — M79642 Pain in left hand: Secondary | ICD-10-CM

## 2014-10-13 DIAGNOSIS — R208 Other disturbances of skin sensation: Secondary | ICD-10-CM

## 2014-10-13 DIAGNOSIS — M79641 Pain in right hand: Secondary | ICD-10-CM

## 2014-10-13 NOTE — Progress Notes (Signed)
NEUROLOGY CONSULTATION NOTE  Caitlin Ward MRN: [id number] DOB: 02-16-1981  Referring provider: Dr. Glori Bickers Primary care provider: Dr. Glori Bickers  Reason for consult:  Hot sensation of palms and bottoms of feet.  HISTORY OF PRESENT ILLNESS: Caitlin Ward is a 34 year old right-handed woman with history of melanoma who presents for temperature intolerance.  Records and labs reviewed.  Beginning in late 2014, she began experiencing a sensation that the palms of her hands feel very hot.  There is no associated pain, per se.  There is no numbness, tingling, abnormal sweating, discoloration or weakness of the hands.  The sensation comes and goes, but she mostly notices it at night.  To a lesser extent, it involves the bottoms of her feet as well.  She otherwise denies neck pain, pain radiating down the arms, abnormal sweating, gastrointestinal symptoms, lightheadedness or body aches.  Nothing specifically exacerbates the sensation.  She wears ice packs at night.  Symptoms have been unchanged over the past year.  She denies family history of neurological problems.  She denies being outdoors in the woods.   She did not start a new medication at the time of onset.  In December 2014, work up was unremarkable, including Sed Rate.  B12 was in the lower limit of normal at 281.  She tried taking B supplement which did not improve her symptoms.  TSH from last month was normal.  PAST MEDICAL HISTORY: Past Medical History  Diagnosis Date  . Malignant melanoma     left arm- wide excision    PAST SURGICAL HISTORY: Past Surgical History  Procedure Laterality Date  . Breast enhancement surgery      MEDICATIONS: Current Outpatient Prescriptions on File Prior to Visit  Medication Sig Dispense Refill  . IUD'S IU by Intrauterine route.      . Multiple Vitamin (MULTIVITAMIN) capsule Take 1 capsule by mouth daily.     No current facility-administered medications on file prior to visit.     ALLERGIES: Allergies  Allergen Reactions  . Penicillins     REACTION: As a small child does not remember exact reaction.    FAMILY HISTORY: Family History  Problem Relation Age of Onset  . Coronary artery disease Maternal Grandfather   . Hypertension Paternal Grandfather   . Nephrolithiasis Mother     SOCIAL HISTORY: History   Social History  . Marital Status: Married    Spouse Name: N/A  . Number of Children: N/A  . 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 Smoking status: Never Smoker   . Smokeless tobacco: Not on file  . Alcohol Use: 0.0 oz/week    0 Standard drinks or equivalent per week     Comment: Occ  . Drug Use: No  . Sexual Activity: Not on file   Other Topics Concern  . Not on file   Social History Narrative   Exercises regularly    REVIEW OF SYSTEMS: Constitutional: No fevers, chills, or sweats, no generalized fatigue, change in appetite Eyes: No visual changes, double vision, eye pain Ear, nose and throat: No hearing loss, ear pain, nasal congestion, sore throat Cardiovascular: No chest pain, palpitations Respiratory:  No shortness of breath at rest or with exertion, wheezes GastrointestinaI: No nausea, vomiting, diarrhea, abdominal pain, fecal incontinence Genitourinary:  No dysuria, urinary retention or frequency Musculoskeletal:  No neck pain, back pain Integumentary: No rash, pruritus,  skin lesions Neurological: as above Psychiatric: No depression, insomnia, anxiety Endocrine: No palpitations, fatigue, diaphoresis, mood swings, change in appetite, change in weight, increased thirst Hematologic/Lymphatic:  No anemia, purpura, petechiae. Allergic/Immunologic: no itchy/runny eyes, nasal congestion, recent allergic reactions, rashes  PHYSICAL EXAM: Filed Vitals:   10/13/14 1224  BP: 108/78  Pulse: 77  Resp: 16   General: No acute distress Head:   Normocephalic/atraumatic Eyes:  fundi unremarkable, without vessel changes, exudates, hemorrhages or papilledema. Neck: supple, no paraspinal tenderness, full range of motion Back: No paraspinal tenderness Heart: regular rate and rhythm Lungs: Clear to auscultation bilaterally. Vascular: No carotid bruits. Neurological Exam: Mental status: alert and oriented to person, place, and time, recent and remote memory intact, fund of knowledge intact, attention and concentration intact, speech fluent and not dysarthric, language intact. Cranial nerves: CN I: not tested CN II: pupils equal, round and reactive to light, visual fields intact, fundi unremarkable, without vessel changes, exudates, hemorrhages or papilledema. CN III, IV, VI:  full range of motion, no nystagmus, no ptosis CN V: facial sensation intact CN VII: upper and lower face symmetric CN VIII: hearing intact CN IX, X: gag intact, uvula midline CN XI: sternocleidomastoid and trapezius muscles intact CN XII: tongue midline Bulk & Tone: normal, no fasciculations. Motor:  5/5 throughout Sensation:  Pinprick and vibration intact Deep Tendon Reflexes:  2+ throughout, toes downgoing Finger to nose testing:  No dysmetria Heel to shin:  No dysmetria Gait:  Normal station and stride.  Able to turn and walk in tandem. Romberg negative.  IMPRESSION: Hot sensation of palms and feet.  Consider small fiber neuropathy.  PLAN: 1.  We will schedule for NCV-EMG testing. 2.  We will check labs, including repeating some labs from over a year ago (ANA, Sed Rate, CRP, extractable nuclear antigen panel, B12, B6, SPEP/UPEP/IFE). 3.  Follow up after nerve conduction study  Thank you for allowing me to take part in the care of this patient.  Metta Clines, DO  CC:  Loura Pardon, MD

## 2014-10-13 NOTE — Addendum Note (Signed)
Addended by: Ella Jubilee on: 10/13/2014 03:14 PM   Modules accepted: Orders

## 2014-10-13 NOTE — Patient Instructions (Signed)
The hot sensation would most likely be related to nerves. 1.  We will get a nerve conduction study test to look for evidence of nerve problems. 2.  We will check the blood for causes of nerve problems (we will check ANA, Sed Rate, B12, B6, CRP, SPEP/UPEP/IFE, and extractible nuclear antigen panel). 3.  Follow up after nerve conduction study.

## 2014-10-27 ENCOUNTER — Telehealth: Payer: Self-pay | Admitting: *Deleted

## 2014-10-27 ENCOUNTER — Encounter: Payer: Self-pay | Admitting: *Deleted

## 2014-10-27 NOTE — Telephone Encounter (Signed)
-----   Message from Pieter Partridge, DO sent at 10/27/2014  2:44 PM EDT ----- The elevated total protein is non-specific.  From my standpoint, I was looking for abnormality in the pattern of the protein that would suggest a cause for the burning in the feet. This was normal.  The cause for elevated total protein is likely not clinically significant.  Sometimes, we would look for a problem in her kidney function except this was recently checked and it looked okay.  Again, I think it is likely nothing to worry about, however if there is a concern, she should address it with her PCP. ----- Message -----    From: Jodell Cipro Der Glas, LPN    Sent: 5/36/1443   2:43 PM      To: Pieter Partridge, DO  Patient ask if the proteins were normal as they were flagged as high 413. ----- Message -----    From: Pieter Partridge, DO    Sent: 10/27/2014   9:17 AM      To: Jodell Cipro Der Ruthell Rummage, LPN  Reviewed labs.  They look okay except the vitamin B12 level is in the lower limit of normal.  This may still suggest B12 deficiency so I would recommend getting B12 supplementation.  I would recommend 1000 mcg IM daily for 7 days, then weekly for 4 weeks, then monthly for 1 year.  She can have this done at our office or schedule with her PCP.

## 2014-10-27 NOTE — Telephone Encounter (Signed)
Patient is aware From my standpoint, I was looking for abnormality in the pattern of the protein that would suggest a cause for the burning in the feet. This was normal. The cause for elevated total protein is likely not clinically significant. Sometimes, we would look for a problem in her kidney function except this was recently checked and it looked okay. Again, I think it is likely nothing to worry about, however if there is a concern, she should address it with her PCP.

## 2014-10-28 NOTE — Telephone Encounter (Signed)
Patient notified as instructed by telephone and verbalized understanding. 

## 2014-10-28 NOTE — Telephone Encounter (Signed)
Pt left v/m; Dr Glori Bickers had referred pt to Dr Tomi Likens; pt has seen Dr Tomi Likens and wants to schedule B 12 injections at [facility] and pt also wants to know Dr Marliss Coots opinion about pts elevated total protein; Dr Tomi Likens was supposed to send lab results. Pt request cb. See 10/27/14 neurology phone note.

## 2014-10-28 NOTE — Telephone Encounter (Signed)
Let her know that I need to look at the records and then comment. Please route this back to me.  Thanks.

## 2014-10-29 ENCOUNTER — Telehealth: Payer: Self-pay | Admitting: *Deleted

## 2014-10-29 NOTE — Telephone Encounter (Signed)
I don't see the results in the EMR to comment on the B12 and protein levels.  I see other doc's comments about the labs, but not the primary results.   Paper copies may have been sent to Dr. Glori Bickers in the meantime.  Either way, I can't comment until I see all the data.   I'll route this to Dr. Glori Bickers in the meantime.   I suspect that Dr. Glori Bickers will be fine with setting up B12 injections here.  If you can get me a copy of the labs, I'll review them.  O/w, I'll defer to Dr. Glori Bickers.  I am sorry that I don't have more to offer currently.   Thanks.

## 2014-10-29 NOTE — Telephone Encounter (Signed)
Left message at Dr Charlene Brooke office to get lab results faxed to office.  Will also contact [facility].

## 2014-10-29 NOTE — Telephone Encounter (Signed)
I can see where the labs were ordered in Epic- but not resulted- please send for those results  Thanks

## 2014-10-29 NOTE — Telephone Encounter (Signed)
Please fax the most recent lab result to [facility] at [phone] att [facility] Call back number [phone]

## 2014-10-29 NOTE — Telephone Encounter (Signed)
Faxed at [phone]

## 2014-11-02 ENCOUNTER — Telehealth: Payer: Self-pay | Admitting: Family Medicine

## 2014-11-02 MED ORDER — CYANOCOBALAMIN 1000 MCG/ML IJ SOLN: 1000.0000 ug | Freq: Every day | INTRAMUSCULAR | Status: DC

## 2014-11-02 NOTE — Telephone Encounter (Signed)
Patient aware of recommendations.  B12 shots scheduled. 1000 mcg IM daily for 7 days, then weekly for 4 weeks, then monthly for 1 year.

## 2014-11-02 NOTE — Telephone Encounter (Signed)
Please let pt know that I rec her labs and they are re assuring  Start the B12 shots as planned  Get the NCV tests 4/14 as planned and then f/u with Dr Loretta Plume  We can continue to watch protein levels over time - but I do not think that is the cause of her problem I signed labs to scan

## 2014-11-05 ENCOUNTER — Ambulatory Visit (INDEPENDENT_AMBULATORY_CARE_PROVIDER_SITE_OTHER): Payer: 59

## 2014-11-05 DIAGNOSIS — E538 Deficiency of other specified B group vitamins: Secondary | ICD-10-CM | POA: Diagnosis not present

## 2014-11-05 MED ORDER — CYANOCOBALAMIN 1000 MCG/ML IJ SOLN
1000.0000 ug | Freq: Once | INTRAMUSCULAR | Status: AC
  Administered 2014-11-05: 1000 ug via INTRAMUSCULAR

## 2014-11-06 ENCOUNTER — Ambulatory Visit (INDEPENDENT_AMBULATORY_CARE_PROVIDER_SITE_OTHER): Payer: 59

## 2014-11-06 DIAGNOSIS — E538 Deficiency of other specified B group vitamins: Secondary | ICD-10-CM | POA: Diagnosis not present

## 2014-11-06 MED ORDER — CYANOCOBALAMIN 1000 MCG/ML IJ SOLN
1000.0000 ug | Freq: Once | INTRAMUSCULAR | Status: AC
  Administered 2014-11-06: 1000 ug via INTRAMUSCULAR

## 2014-11-10 ENCOUNTER — Ambulatory Visit (INDEPENDENT_AMBULATORY_CARE_PROVIDER_SITE_OTHER): Payer: 59

## 2014-11-10 DIAGNOSIS — E538 Deficiency of other specified B group vitamins: Secondary | ICD-10-CM | POA: Diagnosis not present

## 2014-11-10 MED ORDER — CYANOCOBALAMIN 1000 MCG/ML IJ SOLN
1000.0000 ug | Freq: Once | INTRAMUSCULAR | Status: AC
  Administered 2014-11-10: 1000 ug via INTRAMUSCULAR

## 2014-11-11 ENCOUNTER — Ambulatory Visit (INDEPENDENT_AMBULATORY_CARE_PROVIDER_SITE_OTHER): Payer: 59

## 2014-11-11 DIAGNOSIS — E538 Deficiency of other specified B group vitamins: Secondary | ICD-10-CM | POA: Diagnosis not present

## 2014-11-11 MED ORDER — CYANOCOBALAMIN 1000 MCG/ML IJ SOLN
1000.0000 ug | Freq: Once | INTRAMUSCULAR | Status: AC
  Administered 2014-11-11: 1000 ug via INTRAMUSCULAR

## 2014-11-12 ENCOUNTER — Ambulatory Visit (INDEPENDENT_AMBULATORY_CARE_PROVIDER_SITE_OTHER): Payer: 59 | Admitting: Neurology

## 2014-11-12 ENCOUNTER — Ambulatory Visit (INDEPENDENT_AMBULATORY_CARE_PROVIDER_SITE_OTHER): Payer: 59

## 2014-11-12 ENCOUNTER — Encounter: Payer: Self-pay | Admitting: Neurology

## 2014-11-12 DIAGNOSIS — M79642 Pain in left hand: Secondary | ICD-10-CM | POA: Diagnosis not present

## 2014-11-12 DIAGNOSIS — M79641 Pain in right hand: Secondary | ICD-10-CM

## 2014-11-12 DIAGNOSIS — R202 Paresthesia of skin: Secondary | ICD-10-CM

## 2014-11-12 DIAGNOSIS — R208 Other disturbances of skin sensation: Secondary | ICD-10-CM | POA: Diagnosis not present

## 2014-11-12 DIAGNOSIS — E538 Deficiency of other specified B group vitamins: Secondary | ICD-10-CM | POA: Diagnosis not present

## 2014-11-12 MED ORDER — CYANOCOBALAMIN 1000 MCG/ML IJ SOLN
1000.0000 ug | Freq: Once | INTRAMUSCULAR | Status: AC
  Administered 2014-11-12: 1000 ug via INTRAMUSCULAR

## 2014-11-12 NOTE — Procedures (Signed)
[facility]  [address], [address] [place]  [place], [place] [address] Tel: [phone] Fax:  [phone] Test Date:  11/12/2014  Patient: Caitlin Ward DOB: Jul 16, 1981 Physician: Narda Amber, DO  Sex: Female Height: 5\' 5"  Ref Phys: Metta Clines  ID#: [id number] Temp: 33.0C Technician: Laureen Ochs R. NCS T.   Patient Complaints: Patient is a 34 year old female here for evaluation of bilateral hand and feet paresthesias.  NCV & EMG Findings: Extensive electrodiagnostic testing of the right upper and lower extremity shows:  1. Right median, ulnar, and palmar sensory studies are within normal limits. 2. Right median and ulnar motor responses are within normal limits. 3. Right sural and superficial peroneal sensory responses are within normal limits. 4. Right tibial and peroneal motor responses are within normal limits. 5. There is no evidence of active or chronic motor axon loss changes affecting any of the tested muscles. Motor unit configuration and recruitment pattern is normal.  Impression: This is a normal study of the left upper and lower extremities.   In particular, there is no evidence of a large fiber generalized sensorimotor polyneuropathy, carpal tunnel syndrome, or cervical/lumbosacral radiculopathy affecting the right side. A small fiber neuropathy cannot be excluded by this study.   ___________________________ Narda Amber, DO    Nerve Conduction Studies Anti Sensory Summary Table   Site NR Peak (ms) Norm Peak (ms) P-T Amp (V) Norm P-T Amp  Right Median Anti Sensory (2nd Digit)  33C  Wrist    2.8 <3.4 87.4 >20  Right Sup Peroneal Anti Sensory (Ant Lat Mall)  12 cm    2.4 <4.5 17.8 >5  Right Sural Anti Sensory (Lat Mall)  Calf    3.4 <4.5 23.5 >5  Right Ulnar Anti Sensory (5th Digit)  33C  Wrist    2.5 <3.1 63.7 >12   Motor Summary Table   Site NR Onset (ms) Norm Onset (ms) O-P Amp (mV) Norm O-P Amp Site1 Site2 Delta-0 (ms) Dist (cm) Vel (m/s)  Norm Vel (m/s)  Right Median Motor (Abd Poll Brev)  33C  Wrist    3.1 <3.9 12.0 >6 Elbow Wrist 4.5 27.0 60 >50  Elbow    7.6  10.9         Right Peroneal Motor (Ext Dig Brev)  Ankle    2.9 <5.5 6.2 >3 B Fib Ankle 5.5 29.5 54 >40  B Fib    8.4  6.0  Poplt B Fib 1.7 9.5 56 >40  Poplt    10.1  5.9         Right Tibial Motor (Abd Hall Brev)  Ankle    3.4 <6.0 9.5 >8 Knee Ankle 6.8 35.0 51 >40  Knee    10.2  7.4         Right Ulnar Motor (Abd Dig Minimi)  33C  Wrist    2.0 <3.1 12.7 >7 B Elbow Wrist 3.5 21.0 60 >50  B Elbow    5.5  12.7  A Elbow B Elbow 1.7 10.0 59 >50  A Elbow    7.2  12.7          Comparison Summary Table   Site NR Peak (ms) Norm Peak (ms) P-T Amp (V) Site1 Site2 Delta-P (ms) Norm Delta (ms)  Right Median/Ulnar Palm Comparison (Wrist - 8cm)  33C  Median Palm    1.6 <2.2 73.0 Median Palm Ulnar Palm 0.0   Ulnar Palm    1.6 <2.2 19.0  H Reflex Studies   NR H-Lat (ms) Lat Norm (ms) L-R H-Lat (ms)  Right Tibial (Gastroc)     28.84 <35    EMG   Side Muscle Ins Act Fibs Psw Fasc Number Recrt Dur Dur. Amp Amp. Poly Poly. Comment  Right 1stDorInt Nml Nml Nml Nml Nml Nml Nml Nml Nml Nml Nml Nml N/A  Right Ext Indicis Nml Nml Nml Nml Nml Nml Nml Nml Nml Nml Nml Nml N/A  Right PronatorTeres Nml Nml Nml Nml Nml Nml Nml Nml Nml Nml Nml Nml N/A  Right Biceps Nml Nml Nml Nml Nml Nml Nml Nml Nml Nml Nml Nml N/A  Right Triceps Nml Nml Nml Nml Nml Nml Nml Nml Nml Nml Nml Nml N/A  Right Deltoid Nml Nml Nml Nml Nml Nml Nml Nml Nml Nml Nml Nml N/A  Right GluteusMed Nml Nml Nml Nml Nml Nml Nml Nml Nml Nml Nml Nml N/A  Right RectFemoris Nml Nml Nml Nml Nml Nml Nml Nml Nml Nml Nml Nml N/A  Right AntTibialis Nml Nml Nml Nml Nml Nml Nml Nml Nml Nml Nml Nml N/A  Right Gastroc Nml Nml Nml Nml Nml Nml Nml Nml Nml Nml Nml Nml N/A  Right Flex Dig Long Nml Nml Nml Nml Nml Nml Nml Nml Nml Nml Nml Nml N/A      Waveforms:

## 2014-11-13 ENCOUNTER — Ambulatory Visit (INDEPENDENT_AMBULATORY_CARE_PROVIDER_SITE_OTHER): Payer: 59 | Admitting: *Deleted

## 2014-11-13 DIAGNOSIS — D519 Vitamin B12 deficiency anemia, unspecified: Secondary | ICD-10-CM | POA: Diagnosis not present

## 2014-11-13 MED ORDER — CYANOCOBALAMIN 1000 MCG/ML IJ SOLN
1000.0000 ug | Freq: Once | INTRAMUSCULAR | Status: AC
  Administered 2014-11-13: 1000 ug via INTRAMUSCULAR

## 2014-11-17 ENCOUNTER — Ambulatory Visit: Payer: 59

## 2014-11-18 ENCOUNTER — Ambulatory Visit (INDEPENDENT_AMBULATORY_CARE_PROVIDER_SITE_OTHER): Payer: 59 | Admitting: Neurology

## 2014-11-18 ENCOUNTER — Encounter: Payer: Self-pay | Admitting: Neurology

## 2014-11-18 VITALS — BP 118/64 | HR 70 | Resp 16 | Ht 64.0 in | Wt 147.6 lb

## 2014-11-18 DIAGNOSIS — G629 Polyneuropathy, unspecified: Secondary | ICD-10-CM | POA: Insufficient documentation

## 2014-11-18 DIAGNOSIS — E538 Deficiency of other specified B group vitamins: Secondary | ICD-10-CM

## 2014-11-18 NOTE — Patient Instructions (Signed)
1.  We will check to see if the sed rate and B6 were done 2.  In the meantime, continue the B12 shots and see if symptoms improve 3.  Call in 4 weeks.  If no improvement, we will check other causes of small fiber neuropathy 4.  Follow up in 2 months.  If no improvement, will consider treating the discomfort with medications such as gabapentin.

## 2014-11-18 NOTE — Progress Notes (Signed)
NEUROLOGY FOLLOW UP OFFICE NOTE  Caitlin Ward [id number]  HISTORY OF PRESENT ILLNESS: Caitlin Ward is a 34 year old right-handed woman with history of melanoma who presents for temperature intolerance.  NCV report and labs reviewed.  UPDATE: Labs from March, including Sed Rate, SPEP, IFE, anti-ENA, ANA and CRP were unremarkable. B12 was 220, so B12 shots were started a week ago.  No change so far.  She had a NCV-EMG performed on 11/12/14, which was normal.  UPDATE: Beginning in late 2014, she began experiencing a sensation that the palms of her hands feel very hot.  There is no associated pain, per se.  There is no numbness, tingling, abnormal sweating, discoloration or weakness of the hands.  The sensation comes and goes, but she mostly notices it at night.  To a lesser extent, it involves the bottoms of her feet as well.  She otherwise denies neck pain, pain radiating down the arms, abnormal sweating, gastrointestinal symptoms, lightheadedness, dry eyes or mouth, or body aches.  Nothing specifically exacerbates the sensation.  She wears ice packs at night.  Symptoms have been unchanged over the past year.  She denies family history of neurological problems. She denies being outdoors in the woods.   She did not start a new medication at the time of onset.  In December 2014, work up was unremarkable, including Sed Rate.  B12 was in the lower limit of normal at 281.  She tried taking B supplement which did not improve her symptoms.  TSH was normal.  PAST MEDICAL HISTORY: Past Medical History  Diagnosis Date  . Malignant melanoma     left arm- wide excision    MEDICATIONS: Current Outpatient Prescriptions on File Prior to Visit  Medication Sig Dispense Refill  . cyanocobalamin (,VITAMIN B-12,) 1000 MCG/ML injection Inject 1 mL (1,000 mcg total) into the muscle daily. 1000 mcg IM daily for 7 days, then weekly for 4 weeks, then monthly for 1 year. 1 mL 0  . IUD'S IU by  Intrauterine route.      . Multiple Vitamin (MULTIVITAMIN) capsule Take 1 capsule by mouth daily.     No current facility-administered medications on file prior to visit.    ALLERGIES: Allergies  Allergen Reactions  . Penicillins     REACTION: As a small child does not remember exact reaction.    FAMILY HISTORY: Family History  Problem Relation Age of Onset  . Coronary artery disease Maternal Grandfather   . Hypertension Paternal Grandfather   . Nephrolithiasis Mother     SOCIAL HISTORY: History   Social History  . Marital Status: Married    Spouse Name: N/A  . Number of Children: N/A  . 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 Smoking status: Never Smoker   . Smokeless tobacco: Never Used  . Alcohol Use: 0.0 oz/week    0 Standard drinks or equivalent per week     Comment: Occ  . Drug Use: No  . Sexual Activity:    Partners: Male   Other Topics Concern  . Not on file   Social History Narrative   Exercises regularly    REVIEW OF SYSTEMS: Constitutional: No fevers, chills, or sweats, no generalized fatigue, change in appetite Eyes: No visual changes, double vision, eye pain Ear, nose and throat: No hearing loss, ear pain, nasal congestion, sore throat Cardiovascular: No chest pain, palpitations  Respiratory:  No shortness of breath at rest or with exertion, wheezes GastrointestinaI: No nausea, vomiting, diarrhea, abdominal pain, fecal incontinence Genitourinary:  No dysuria, urinary retention or frequency Musculoskeletal:  No neck pain, back pain Integumentary: No rash, pruritus, skin lesions Neurological: as above Psychiatric: No depression, insomnia, anxiety Endocrine: No palpitations, fatigue, diaphoresis, mood swings, change in appetite, change in weight, increased thirst Hematologic/Lymphatic:  No anemia, purpura, petechiae. Allergic/Immunologic: no itchy/runny eyes, nasal  congestion, recent allergic reactions, rashes  PHYSICAL EXAM: Filed Vitals:   11/18/14 0801  BP: 118/64  Pulse: 70  Resp: 16   General: No acute distress Head:  Normocephalic/atraumatic Eyes:  Fundoscopic exam unremarkable without vessel changes, exudates, hemorrhages or papilledema.  Neurological Exam: alert and oriented to person, place, and time. Attention span and concentration intact, recent and remote memory intact, fund of knowledge intact.  Speech fluent and not dysarthric, language intact.  CN II-XII intact. Fundoscopic exam unremarkable without vessel changes, exudates, hemorrhages or papilledema.  Bulk and tone normal, muscle strength 5/5 throughout.  Sensation to pinprick and vibration intact.  Deep tendon reflexes 2+ throughout, toes downgoing.  Finger to nose testing intact.  Gait normal.  IMPRESSION: Hot sensation in palms and feet.  Consider small fiber neuropathy.  May possibly be due to B12 deficiency.  PLAN: 1.  Will have her continue B12 shots and see how she does. 2.  She will call in 4 weeks.  If symptoms not improved, we will check labs for other treatable causes (B6, Sjogren's, Celiac, HIV) 3.  She will follow up in 2 months.  If no improvement, we can start medication such as gabapentin to treat the symptoms.  15 minutes spent with patient, over 50% spent discussing results and diagnoses and plan.  Metta Clines, DO  CC:  Loura Pardon, MD

## 2014-11-19 ENCOUNTER — Ambulatory Visit (INDEPENDENT_AMBULATORY_CARE_PROVIDER_SITE_OTHER): Payer: 59 | Admitting: *Deleted

## 2014-11-19 DIAGNOSIS — D519 Vitamin B12 deficiency anemia, unspecified: Secondary | ICD-10-CM | POA: Diagnosis not present

## 2014-11-19 MED ORDER — CYANOCOBALAMIN 1000 MCG/ML IJ SOLN
1000.0000 ug | Freq: Once | INTRAMUSCULAR | Status: AC
  Administered 2014-11-19: 1000 ug via INTRAMUSCULAR

## 2014-11-26 ENCOUNTER — Ambulatory Visit (INDEPENDENT_AMBULATORY_CARE_PROVIDER_SITE_OTHER): Payer: 59 | Admitting: *Deleted

## 2014-11-26 DIAGNOSIS — E538 Deficiency of other specified B group vitamins: Secondary | ICD-10-CM

## 2014-11-26 MED ORDER — CYANOCOBALAMIN 1000 MCG/ML IJ SOLN
1000.0000 ug | Freq: Once | INTRAMUSCULAR | Status: AC
  Administered 2014-11-26: 1000 ug via INTRAMUSCULAR

## 2014-12-02 ENCOUNTER — Ambulatory Visit (INDEPENDENT_AMBULATORY_CARE_PROVIDER_SITE_OTHER): Payer: 59

## 2014-12-02 DIAGNOSIS — E538 Deficiency of other specified B group vitamins: Secondary | ICD-10-CM | POA: Diagnosis not present

## 2014-12-02 MED ORDER — CYANOCOBALAMIN 1000 MCG/ML IJ SOLN
1000.0000 ug | Freq: Once | INTRAMUSCULAR | Status: AC
  Administered 2014-12-02: 1000 ug via INTRAMUSCULAR

## 2014-12-08 ENCOUNTER — Ambulatory Visit: Payer: 59

## 2014-12-11 ENCOUNTER — Ambulatory Visit (INDEPENDENT_AMBULATORY_CARE_PROVIDER_SITE_OTHER): Payer: 59

## 2014-12-11 DIAGNOSIS — E538 Deficiency of other specified B group vitamins: Secondary | ICD-10-CM

## 2014-12-11 MED ORDER — CYANOCOBALAMIN 1000 MCG/ML IJ SOLN
1000.0000 ug | Freq: Once | INTRAMUSCULAR | Status: AC
  Administered 2014-12-11: 1000 ug via INTRAMUSCULAR

## 2014-12-16 ENCOUNTER — Ambulatory Visit (INDEPENDENT_AMBULATORY_CARE_PROVIDER_SITE_OTHER): Payer: 59

## 2014-12-16 DIAGNOSIS — E538 Deficiency of other specified B group vitamins: Secondary | ICD-10-CM | POA: Diagnosis not present

## 2014-12-16 MED ORDER — CYANOCOBALAMIN 1000 MCG/ML IJ SOLN
1000.0000 ug | Freq: Once | INTRAMUSCULAR | Status: AC
  Administered 2014-12-16: 1000 ug via INTRAMUSCULAR

## 2014-12-18 ENCOUNTER — Telehealth: Payer: Self-pay | Admitting: *Deleted

## 2014-12-18 NOTE — Telephone Encounter (Signed)
Patient calling to report she has completed her 1 week b-12 injections and does not see a difference. Please advise  Call back number [phone]

## 2014-12-18 NOTE — Telephone Encounter (Signed)
She wanted to let you know that she has completed course of her initial daily b12  injections for 1 week, & 4 weeks of weekly injections without any improvement with her sxs. Please advise.

## 2014-12-18 NOTE — Telephone Encounter (Signed)
Lmovm to return my call. 

## 2014-12-21 ENCOUNTER — Other Ambulatory Visit: Payer: Self-pay | Admitting: *Deleted

## 2014-12-21 DIAGNOSIS — G629 Polyneuropathy, unspecified: Secondary | ICD-10-CM

## 2014-12-21 DIAGNOSIS — G608 Other hereditary and idiopathic neuropathies: Secondary | ICD-10-CM

## 2014-12-21 NOTE — Telephone Encounter (Signed)
I spoke with patient she is aware of labs that need to be drawn I have faxed them to [place] as requested

## 2014-12-21 NOTE — Telephone Encounter (Signed)
I would check Celiac panel, B6, HIV and 2 hour glucose tolerance test.  As per my last note, the ENA was normal.  I don't see it scanned into [facility], so can we make sure we have another copy?  Thanks.

## 2014-12-22 ENCOUNTER — Other Ambulatory Visit: Payer: Self-pay | Admitting: Neurology

## 2014-12-25 LAB — HIV ANTIBODY (ROUTINE TESTING W REFLEX): HIV SCREEN 4TH GENERATION: NONREACTIVE

## 2014-12-25 LAB — CELIAC DISEASE PANEL
Endomysial IgA: NEGATIVE
IgA/Immunoglobulin A, Serum: 283 mg/dL (ref 87–352)

## 2014-12-25 LAB — VITAMIN B6: VITAMIN B6: 13.6 ug/L (ref 2.0–32.8)

## 2014-12-26 LAB — GLUCOSE TOLERANCE, 2 HOURS
Glucose, 2 hour: 82 mg/dL (ref 65–139)
Glucose, GTT - Fasting: 113 mg/dL — ABNORMAL HIGH (ref 65–99)

## 2014-12-30 ENCOUNTER — Telehealth: Payer: Self-pay | Admitting: *Deleted

## 2014-12-30 NOTE — Telephone Encounter (Signed)
I don't think she needs to come in sooner than her already scheduled appointment.  At this time, I don't have any other specific tests to order.  Sometimes, we just can't find a specific cause for her symptoms.  I would treat the symptoms if it bothers her.  We can try gabapentin 300mg  at bedtime.

## 2014-12-30 NOTE — Telephone Encounter (Signed)
Patient is aware of normal Glucose test

## 2014-12-30 NOTE — Telephone Encounter (Signed)
Patient is asking since the B12 injection are not helping and labs are normal does she need to come in earlier to discuss what she needs to do next ? Please advise

## 2014-12-30 NOTE — Telephone Encounter (Signed)
-----   Message from Pieter Partridge, DO sent at 12/30/2014  7:03 AM EDT ----- 2 hour glucose tolerance test is normal. ----- Message -----    From: [facility] Lab Results In Interface    Sent: 12/29/2014   5:50 PM      To: Pieter Partridge, DO

## 2014-12-30 NOTE — Telephone Encounter (Signed)
Patient is aware I left message on her voicemail         I don't think she needs to come in sooner than her already scheduled appointment. At this time, I don't have any other specific tests to order. Sometimes, we just can't find a specific cause for her symptoms. I would treat the symptoms if it bothers her. We can try gabapentin 300mg  at bedtime.

## 2014-12-30 NOTE — Telephone Encounter (Signed)
-----   Message from Pieter Partridge, DO sent at 12/29/2014  6:35 AM EDT ----- Labs are okay ----- Message -----    From: [facility] Lab Results In Interface    Sent: 12/23/2014   5:54 PM      To: Pieter Partridge, DO

## 2015-01-19 ENCOUNTER — Ambulatory Visit (INDEPENDENT_AMBULATORY_CARE_PROVIDER_SITE_OTHER): Payer: 59 | Admitting: Neurology

## 2015-01-19 ENCOUNTER — Encounter: Payer: Self-pay | Admitting: Neurology

## 2015-01-19 VITALS — BP 110/70 | HR 66 | Resp 16 | Ht 64.0 in | Wt 142.0 lb

## 2015-01-19 DIAGNOSIS — R208 Other disturbances of skin sensation: Secondary | ICD-10-CM

## 2015-01-19 MED ORDER — GABAPENTIN 300 MG PO CAPS: 300.0000 mg | ORAL_CAPSULE | Freq: Every day | ORAL | Status: DC

## 2015-01-19 NOTE — Progress Notes (Signed)
NEUROLOGY FOLLOW UP OFFICE NOTE  Caitlin Ward [id number]  HISTORY OF PRESENT ILLNESS: Caitlin Ward is a 34 year old right-handed woman with history of melanoma who presents for temperature intolerance.    UPDATE: Additonal labs, including B6, HIV antibodies, Celiac disease panel, and 2 hour glucose tolerance test, were unremarkable.    UPDATE: Beginning in late 2014, she began experiencing a sensation that the palms of her hands feel very hot.  There is no associated pain, per se.  There is no numbness, tingling, abnormal sweating, discoloration or weakness of the hands.  The sensation comes and goes, but she mostly notices it at night.  To a lesser extent, it involves the bottoms of her feet as well.  She otherwise denies neck pain, pain radiating down the arms, abnormal sweating, gastrointestinal symptoms, lightheadedness, dry eyes or mouth, or body aches.  Nothing specifically exacerbates the sensation.  She wears ice packs at night.  Symptoms have been unchanged over the past year.  She denies family history of neurological problems. She denies being outdoors in the woods.   She did not start a new medication at the time of onset.  In December 2014, work up was unremarkable, including Sed Rate.  B12 was in the lower limit of normal at 281.  She tried taking B supplement which did not improve her symptoms.  Labs, including TSH, Sed Rate, SPEP, IFE, anti-ENA, ANA and CRP were unremarkable. B12 was 220, so B12 shots were started a week ago.  No change so far.  She had a NCV-EMG performed on 11/12/14, which was normal.  PAST MEDICAL HISTORY: Past Medical History  Diagnosis Date  . Malignant melanoma     left arm- wide excision    MEDICATIONS: Current Outpatient Prescriptions on File Prior to Visit  Medication Sig Dispense Refill  . cyanocobalamin (,VITAMIN B-12,) 1000 MCG/ML injection Inject 1 mL (1,000 mcg total) into the muscle daily. 1000 mcg IM daily for 7 days, then  weekly for 4 weeks, then monthly for 1 year. 1 mL 0  . IUD'S IU by Intrauterine route.      . Multiple Vitamin (MULTIVITAMIN) capsule Take 1 capsule by mouth daily.     No current facility-administered medications on file prior to visit.    ALLERGIES: Allergies  Allergen Reactions  . Penicillins     REACTION: As a small child does not remember exact reaction.    FAMILY HISTORY: Family History  Problem Relation Age of Onset  . Coronary artery disease Maternal Grandfather   . Hypertension Paternal Grandfather   . Nephrolithiasis Mother     SOCIAL HISTORY: History   Social History  . Marital Status: Married    Spouse Name: N/A  . Number of Children: N/A  . 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 Smoking status: Never Smoker   . Smokeless tobacco: Never Used  . Alcohol Use: 0.0 oz/week    0 Standard drinks or equivalent per week     Comment: Occ  . Drug Use: No  . Sexual Activity:    Partners: Male   Other Topics Concern  . Not on file   Social History Narrative   Exercises regularly    REVIEW OF SYSTEMS: Constitutional: No fevers, chills, or sweats, no generalized fatigue, change in appetite Eyes: No visual changes, double vision, eye pain Ear, nose and throat: No hearing loss,  ear pain, nasal congestion, sore throat Cardiovascular: No chest pain, palpitations Respiratory:  No shortness of breath at rest or with exertion, wheezes GastrointestinaI: No nausea, vomiting, diarrhea, abdominal pain, fecal incontinence Genitourinary:  No dysuria, urinary retention or frequency Musculoskeletal:  No neck pain, back pain Integumentary: No rash, pruritus, skin lesions Neurological: as above Psychiatric: No depression, insomnia, anxiety Endocrine: No palpitations, fatigue, diaphoresis, mood swings, change in appetite, change in weight, increased thirst Hematologic/Lymphatic:  No anemia,  purpura, petechiae. Allergic/Immunologic: no itchy/runny eyes, nasal congestion, recent allergic reactions, rashes  PHYSICAL EXAM: Filed Vitals:   01/19/15 1530  BP: 110/70  Pulse: 66  Resp: 16   General: No acute distress Head:  Normocephalic/atraumatic Eyes:  Fundoscopic exam unremarkable without vessel changes, exudates, hemorrhages or papilledema. Neck: supple, no paraspinal tenderness, full range of motion Heart:  Regular rate and rhythm Lungs:  Clear to auscultation bilaterally Back: No paraspinal tenderness Neurological Exam: alert and oriented to person, place, and time. Attention span and concentration intact, recent and remote memory intact, fund of knowledge intact.  Speech fluent and not dysarthric, language intact.  CN II-XII intact. Fundoscopic exam unremarkable without vessel changes, exudates, hemorrhages or papilledema.  Bulk and tone normal, muscle strength 5/5 throughout.  Sensation to light touch, temperature and vibration intact.  Deep tendon reflexes 2+ throughout, toes downgoing.  Finger to nose and heel to shin testing intact.  Gait normal, Romberg negative.  IMPRESSION: Hot sensation in palms and feet.  Consider small fiber neuropathy.   PLAN: 1.  Start gabapentin 300mg  at bedtime.  She is to call in 1 week with update. 2.  We will evaluate central nervous system for possible cause of dysesthesia of hands and feet.  MRI of cervical spine 3.  Follow up in 2 months.  15 minutes spent face to face with patient, over 50% spent discussing plan.  Metta Clines, DO  CC: Loura Pardon, MD

## 2015-01-19 NOTE — Patient Instructions (Addendum)
Start gabapentin 300mg  at bedtime.  Side effects may include sleepiness or dizziness.  Call in 1 weeks with update. We will get MRI of cervical spine [facility]  01/29/15 2:45pm Follow up in 2 months.

## 2015-01-29 ENCOUNTER — Ambulatory Visit (HOSPITAL_COMMUNITY)
Admission: RE | Admit: 2015-01-29 | Discharge: 2015-01-29 | Disposition: A | Discharge status: Home / Self Care | Payer: 59 | Source: Ambulatory Visit | Attending: Neurology | Admitting: Neurology

## 2015-01-29 DIAGNOSIS — R208 Other disturbances of skin sensation: Secondary | ICD-10-CM

## 2015-01-29 DIAGNOSIS — M4802 Spinal stenosis, cervical region: Secondary | ICD-10-CM | POA: Diagnosis not present

## 2015-01-29 DIAGNOSIS — M5022 Other cervical disc displacement, mid-cervical region: Secondary | ICD-10-CM | POA: Diagnosis not present

## 2015-02-02 ENCOUNTER — Telehealth: Payer: Self-pay | Admitting: Neurology

## 2015-02-02 ENCOUNTER — Telehealth: Payer: Self-pay | Admitting: Family Medicine

## 2015-02-02 ENCOUNTER — Telehealth: Payer: Self-pay | Admitting: *Deleted

## 2015-02-02 NOTE — Telephone Encounter (Signed)
Patient is aware of MRI results . She will follow up with Dr Glori Bickers . She also states the Gabapentin 300 mg at HS is not working  Please advise

## 2015-02-02 NOTE — Telephone Encounter (Signed)
-----   Message from Pieter Partridge, DO sent at 02/02/2015  6:46 AM EDT ----- MRI of the cervical spine shows a mild disc bulge but nothing to explain the burning sensation in the hands and feet.  Incidentally, there is mild low signal in the marrow of the T1 vertebral body.  This may be of no clinical significance but she may want to discuss with her PCP to determine if it should further be investigated.  I will send this note to Dr. Glori Bickers, as well.

## 2015-02-02 NOTE — Telephone Encounter (Signed)
Pt made an appointment on Friday  to follow up on her mri results she had done @ Dr Loretta Plume.  She had this done on Friday. And would a call back when dr tower reads notes

## 2015-02-02 NOTE — Telephone Encounter (Signed)
Per patient burning is only at night  She will increase to 600mg  at HS

## 2015-02-02 NOTE — Telephone Encounter (Signed)
If the burning is only an issue at night, she can increase it to 600mg  at bedtime.  If it is a problem during the day, she can increase to 300mg  three times daily.

## 2015-02-02 NOTE — Telephone Encounter (Signed)
Left message for patient to return my call regarding  MRI

## 2015-02-02 NOTE — Telephone Encounter (Signed)
Pt is returning your call  Please call her at [phone]

## 2015-02-02 NOTE — Telephone Encounter (Signed)
I'm not sure what the decreased marrow signal from T1 means - we will disc at f/u and do some labs to check for anemia as well

## 2015-02-03 NOTE — Telephone Encounter (Signed)
Pt notified Dr. Glori Bickers will discuss MRI results at her f/u and also do lab testing for anemia, pt advise to keep her appt on Friday

## 2015-02-05 ENCOUNTER — Encounter: Payer: Self-pay | Admitting: Family Medicine

## 2015-02-05 ENCOUNTER — Ambulatory Visit (INDEPENDENT_AMBULATORY_CARE_PROVIDER_SITE_OTHER): Payer: 59 | Admitting: Family Medicine

## 2015-02-05 VITALS — BP 126/74 | HR 72 | Temp 98.0°F | Ht 64.0 in | Wt 144.2 lb

## 2015-02-05 DIAGNOSIS — R937 Abnormal findings on diagnostic imaging of other parts of musculoskeletal system: Secondary | ICD-10-CM | POA: Diagnosis not present

## 2015-02-05 NOTE — Patient Instructions (Signed)
Lab today to check for anemia for iron stores   Will update when this returns

## 2015-02-05 NOTE — Progress Notes (Signed)
Subjective:    Patient ID: Caitlin Ward, female    DOB: [birth year], 34 y.o.   MRN: [id number]  HPI Here for f/u of MR cervical spine   Pt was eval for burning in hands /feet   Mr Cervical Spine Wo Contrast  01/29/2015   CLINICAL DATA:  Dysesthesias. Healing of heat in the palms of her hands beginning May 2014. She occasionally has the same sensation along the plantar surface of her feet.  EXAM: MRI CERVICAL SPINE WITHOUT CONTRAST  TECHNIQUE: Multiplanar, multisequence MR imaging of the cervical spine was performed. No intravenous contrast was administered.  COMPARISON:  None.  FINDINGS: Normal signal is present in the cervical and upper thoracic spinal cord to the lowest imaged level, T2-3. T1 marrow signal is somewhat low. No focal lesions are present. The craniocervical junction is within normal limits. The visualized intracranial contents are normal. Flow is present in the vertebral arteries.  A left paramedian disc osteophyte complex is present at C5-6 with partial effacement of the ventral CSF. The foramina are patent at this level.  No other significant focal disc protrusion or stenosis is present within the cervical spine.  IMPRESSION: 1. 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 2. No other significant focal disc protrusion or stenosis. 3. Mild diffuse decreased T1 marrow signal. This may be related to borderline anemia versus is chronic hypoxia most commonly.   Electronically Signed   By: San Morelle M.D.   On: 01/29/2015 15:54    Pt has no problem with breathing or snoring at night   Normally wakes up pretty rested - that is reassuring  Is very active   Lab Results  Component Value Date   WBC 5.9 09/23/2014   HGB 12.1 09/23/2014   HCT 36.2 09/23/2014   MCV 88 09/23/2014   PLT 327 09/23/2014    Does not have much of a period  This week has a heavier one - on day 3  Still has an IUD  Has never been anemic  Does not donate blood     For her burning hands and feet - trying gabapentin (just makes her sleepy)  Suspect small fiber neuropathy  Still sleeps with ice packs   Patient Active Problem List   Diagnosis Date Noted  . 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 Temperature intolerance 09/28/2014  . Hyperlipidemia, mild 09/19/2013  . Neuropathic pain of hand 07/22/2013  . Sensation of feeling hot 07/22/2013  . Encounter for routine gynecological examination 07/30/2012  . Routine general medical examination at a health care facility 07/21/2012  . Joint pain 05/28/2012  . Myofascial pain 05/28/2012  . Pain of right lower leg 05/28/2012  . Hepatic lesion 03/07/2012  . Hematuria, microscopic 10/23/2011  . Anxiety and depression 03/28/2011  . SLEEP DISORDER 04/27/2010  . DYSMENORRHEA 02/27/2007   Past Medical History  Diagnosis Date  . Malignant melanoma     left arm- wide excision   Past Surgical History  Procedure Laterality Date  . Breast enhancement surgery     History  Substance Use Topics  . Smoking status: Never Smoker   . Smokeless tobacco: Never Used  . Alcohol Use: 0.0 oz/week    0 Standard drinks or equivalent per week     Comment: Occ   Family History  Problem Relation Age of Onset  . Coronary artery disease Maternal Grandfather   . Hypertension Paternal Grandfather   . Nephrolithiasis Mother    Allergies  Allergen Reactions  . Penicillins     REACTION: As a small child does not remember exact reaction.   Current Outpatient Prescriptions on File Prior to Visit  Medication Sig Dispense Refill  . cyanocobalamin (,VITAMIN B-12,) 1000 MCG/ML injection Inject 1 mL (1,000 mcg total) into the muscle daily. 1000 mcg IM daily for 7 days, then weekly for 4 weeks, then monthly for 1 year. 1 mL 0  . gabapentin (NEURONTIN) 300 MG capsule Take 1 capsule (300 mg total) by mouth at bedtime. 30 capsule 1  . IUD'S IU by Intrauterine route.      . Multiple Vitamin (MULTIVITAMIN) capsule Take 1 capsule  by mouth daily.     No current facility-administered medications on file prior to visit.      Review of Systems Review of Systems  Constitutional: Negative for fever, appetite change, fatigue and unexpected weight change.  Eyes: Negative for pain and visual disturbance.  Respiratory: Negative for cough and shortness of breath.   Cardiovascular: Negative for cp or palpitations    Gastrointestinal: Negative for nausea, diarrhea and constipation.  Genitourinary: Negative for urgency and frequency.  Skin: Negative for pallor or rash   Neurological: Negative for weakness, light-headedness, numbness and headaches. pos for hot sensation in hands and feet  Hematological: Negative for adenopathy. Does not bruise/bleed easily.  Psychiatric/Behavioral: Negative for dysphoric mood. The patient is not nervous/anxious.         Objective:   Physical Exam  Constitutional: She appears well-developed and well-nourished. No distress.  Well appearing   HENT:  Head: Normocephalic and atraumatic.  Eyes: Conjunctivae and EOM are normal. Pupils are equal, round, and reactive to light. No scleral icterus.  Neck: Normal range of motion. Neck supple. No JVD present. Carotid bruit is not present. No thyromegaly present.  Cardiovascular: Normal rate, regular rhythm, normal heart sounds and intact distal pulses.   No murmur heard. Pulmonary/Chest: Effort normal and breath sounds normal. No respiratory distress. She has no wheezes. She has no rales.  Abdominal: Soft. Bowel sounds are normal. She exhibits no distension and no mass. There is no tenderness. There is no rebound and no guarding.  No HSM  Musculoskeletal: She exhibits no edema or tenderness.  No neck tenderness today  Lymphadenopathy:    She has no cervical adenopathy.  Neurological: She is alert. She has normal reflexes. No cranial nerve deficit. She exhibits normal muscle tone. Coordination normal.  Skin: Skin is warm and dry. No rash noted. No  erythema. No pallor.  Psychiatric: She has a normal mood and affect.          Assessment & Plan:   Problem List Items Addressed This Visit    Abnormal MRI, cervical spine - Primary    Mild diffuse T1 marrow signal  Unsure of significance if any-this can occur with anemia and also hypoxia No symptoms whatsoever of sleep apnea / and pt has nl pulse ox  Cbc and ferritin today      Relevant Orders   CBC with Differential/Platelet   Ferritin

## 2015-02-05 NOTE — Progress Notes (Signed)
Pre visit review using our clinic review tool, if applicable. No additional management support is needed unless otherwise documented below in the visit note. 

## 2015-02-07 NOTE — Assessment & Plan Note (Signed)
Mild diffuse T1 marrow signal  Unsure of significance if any-this can occur with anemia and also hypoxia No symptoms whatsoever of sleep apnea / and pt has nl pulse ox  Cbc and ferritin today

## 2015-02-09 ENCOUNTER — Other Ambulatory Visit: Payer: Self-pay | Admitting: Oncology

## 2015-02-09 LAB — CBC WITH DIFFERENTIAL/PLATELET
BASOS ABS: 0 10*3/uL (ref 0.0–0.2)
BASOS: 0 %
EOS (ABSOLUTE): 0.1 10*3/uL (ref 0.0–0.4)
Eos: 1 %
Hematocrit: 43.4 % (ref 34.0–46.6)
Hemoglobin: 14.5 g/dL (ref 11.1–15.9)
IMMATURE GRANS (ABS): 0 10*3/uL (ref 0.0–0.1)
Immature Granulocytes: 0 %
LYMPHS ABS: 2.1 10*3/uL (ref 0.7–3.1)
Lymphs: 32 %
MCH: 29.7 pg (ref 26.6–33.0)
MCHC: 33.4 g/dL (ref 31.5–35.7)
MCV: 89 fL (ref 79–97)
Monocytes Absolute: 0.2 10*3/uL (ref 0.1–0.9)
Monocytes: 4 %
NEUTROS ABS: 4.2 10*3/uL (ref 1.4–7.0)
Neutrophils: 63 %
PLATELETS: 284 10*3/uL (ref 150–379)
RBC: 4.89 x10E6/uL (ref 3.77–5.28)
RDW: 13.2 % (ref 12.3–15.4)
WBC: 6.7 10*3/uL (ref 3.4–10.8)

## 2015-02-09 LAB — FERRITIN: Ferritin: 38 ng/mL (ref 15–150)

## 2015-02-10 ENCOUNTER — Other Ambulatory Visit: Payer: Self-pay | Admitting: *Deleted

## 2015-02-10 ENCOUNTER — Telehealth: Payer: Self-pay | Admitting: Neurology

## 2015-02-10 ENCOUNTER — Telehealth: Payer: Self-pay | Admitting: Family Medicine

## 2015-02-10 MED ORDER — GABAPENTIN 300 MG PO CAPS: 300.0000 mg | ORAL_CAPSULE | Freq: Every day | ORAL | Status: DC

## 2015-02-10 NOTE — Telephone Encounter (Signed)
Pt called about refills on prescription "Gabapentin" she said she is running out and will be going on vacation soon, call back PH# [phone]

## 2015-02-10 NOTE — Telephone Encounter (Signed)
Addressed through result notes  

## 2015-02-10 NOTE — Telephone Encounter (Signed)
Pt called checking on lab results 

## 2015-02-16 ENCOUNTER — Telehealth: Payer: Self-pay | Admitting: Family Medicine

## 2015-02-16 DIAGNOSIS — R937 Abnormal findings on diagnostic imaging of other parts of musculoskeletal system: Secondary | ICD-10-CM

## 2015-02-16 NOTE — Telephone Encounter (Signed)
-----   Message from Tammi Sou, [place] sent at 02/16/2015  4:18 PM EDT ----- Pt agrees with referral to hematologist, I advise her one of our [facility] will call to schedule appt, please put referral in

## 2015-02-16 NOTE — Telephone Encounter (Signed)
Referral done

## 2015-02-23 ENCOUNTER — Telehealth: Payer: Self-pay | Admitting: Neurology

## 2015-02-23 ENCOUNTER — Other Ambulatory Visit: Payer: Self-pay | Admitting: *Deleted

## 2015-02-23 MED ORDER — GABAPENTIN 300 MG PO CAPS
ORAL_CAPSULE | ORAL | Status: DC
Start: 2015-02-23 — End: 2015-11-02

## 2015-02-23 NOTE — Telephone Encounter (Signed)
Patient states she is having more burning in hands and feet they are really hot.   She is on 600mg   Gabapentin  QD HS only .  Please advise

## 2015-02-23 NOTE — Telephone Encounter (Signed)
Pt called and stated that her hands and feet were really hot and did not know if it was actually the heat or the medication she is taking/Dawn CB# [phone]

## 2015-02-23 NOTE — Telephone Encounter (Signed)
Notes reviewed. Can try increasing the gabapentin to 300mg  in AM, 600mg  in PM. thanks

## 2015-02-23 NOTE — Telephone Encounter (Signed)
Patient is aware that Notes reviewed. Can try increasing the gabapentin to 300mg  in AM, 600mg  in PM. thanks Medication has been called into [facility]

## 2015-02-25 ENCOUNTER — Telehealth: Payer: Self-pay | Admitting: Neurology

## 2015-02-25 NOTE — Telephone Encounter (Signed)
VM-Pt called in regards to her medication Gabapentin and needing an approval/Dawn CB# [phone]

## 2015-02-25 NOTE — Telephone Encounter (Signed)
Patient states she need prior auth for Gabapentin  And it now has to be ordered through [employer]  Because it is now a maintenance drug . I have not received anything stating this needs prior auth .

## 2015-03-02 ENCOUNTER — Telehealth: Payer: Self-pay | Admitting: *Deleted

## 2015-03-02 NOTE — Telephone Encounter (Signed)
Patient following up on fax from mail order.

## 2015-03-18 ENCOUNTER — Inpatient Hospital Stay: Payer: 59 | Attending: Hematology and Oncology | Admitting: Hematology and Oncology

## 2015-03-18 ENCOUNTER — Encounter: Payer: Self-pay | Admitting: Hematology and Oncology

## 2015-03-18 ENCOUNTER — Inpatient Hospital Stay: Payer: 59

## 2015-03-18 VITALS — BP 107/71 | HR 80 | Temp 96.8°F | Resp 18 | Ht 64.0 in | Wt 143.7 lb

## 2015-03-18 DIAGNOSIS — R937 Abnormal findings on diagnostic imaging of other parts of musculoskeletal system: Secondary | ICD-10-CM | POA: Insufficient documentation

## 2015-03-18 DIAGNOSIS — M79643 Pain in unspecified hand: Secondary | ICD-10-CM | POA: Diagnosis not present

## 2015-03-18 DIAGNOSIS — M4800 Spinal stenosis, site unspecified: Secondary | ICD-10-CM

## 2015-03-18 DIAGNOSIS — M5382 Other specified dorsopathies, cervical region: Secondary | ICD-10-CM | POA: Diagnosis not present

## 2015-03-18 DIAGNOSIS — Z85828 Personal history of other malignant neoplasm of skin: Secondary | ICD-10-CM | POA: Insufficient documentation

## 2015-03-18 DIAGNOSIS — M79646 Pain in unspecified finger(s): Secondary | ICD-10-CM

## 2015-03-18 DIAGNOSIS — M5022 Other cervical disc displacement, mid-cervical region: Secondary | ICD-10-CM | POA: Diagnosis not present

## 2015-03-18 NOTE — Progress Notes (Signed)
Patient states she is here today for an abnormal MRI. States she has been having a "heat" sensation in her hands and feet for a year. She denies numbness or tingling in hands or feet, but states the heat sensation is so bad, she has to sleep with ice packs in her hands and a fan blowing on her feet.

## 2015-03-22 ENCOUNTER — Ambulatory Visit: Payer: 59 | Admitting: Neurology

## 2015-03-24 ENCOUNTER — Encounter: Payer: Self-pay | Admitting: Neurology

## 2015-03-24 ENCOUNTER — Ambulatory Visit (INDEPENDENT_AMBULATORY_CARE_PROVIDER_SITE_OTHER): Payer: 59 | Admitting: Neurology

## 2015-03-24 VITALS — BP 120/78 | HR 76 | Ht 64.0 in | Wt 145.0 lb

## 2015-03-24 DIAGNOSIS — M792 Neuralgia and neuritis, unspecified: Secondary | ICD-10-CM

## 2015-03-24 DIAGNOSIS — G5690 Unspecified mononeuropathy of unspecified upper limb: Secondary | ICD-10-CM

## 2015-03-24 NOTE — Progress Notes (Signed)
NEUROLOGY FOLLOW UP OFFICE NOTE  Caitlin Ward [id number]  HISTORY OF PRESENT ILLNESS: Caitlin Ward is a 34 year old right-handed woman with history of melanoma who presents for temperature intolerance.  History obtained by PCP note and patient.  Labs and cervical MRI images reviewed.  UPDATE: She had an MRI of the cervical spine on 01/29/15, which showed an incidental left paramedian disc protrusion at C5-6, but no significant cord impingement or myelopathy to explain her symptoms.  It did show mild decreased T1 signal in the marrow. She was referred to hematology.  CBC over the past year showed increase in HCT from 36.2 to 43.4%, so she is being worked up for an underlying hematologic condition, such as polycythemia vera.  She was started on gabapentin and currently is taking 300mg  in afternoon and 600mg  at night.  It was initially helpful but the symptoms are starting to get worse at night.  She denies symptoms in the morning.  UPDATE: Beginning in late 2014, she began experiencing a sensation that the palms of her hands feel very hot.  There is no associated pain, per se.  There is no numbness, tingling, abnormal sweating, discoloration or weakness of the hands.  The sensation comes and goes, but she mostly notices it at night.  To a lesser extent, it involves the bottoms of her feet as well.  She otherwise denies neck pain, pain radiating down the arms, abnormal sweating, gastrointestinal symptoms, lightheadedness, dry eyes or mouth, or body aches.  Nothing specifically exacerbates the sensation.  She wears ice packs at night.  Symptoms have been unchanged over the past year.  She denies family history of neurological problems. She denies being outdoors in the woods.   She did not start a new medication at the time of onset.  In December 2014, work up was unremarkable, including Sed Rate.  B12 was in the lower limit of normal at 281.  She tried taking B supplement which did not improve  her symptoms.  Labs, including TSH, Sed Rate, SPEP, IFE, anti-ENA, ANA and CRP, B6, HIV antibodies, Celiac disease panel, and 2 hour glucose tolerance test, were unremarkable.   B12 was 220.  She received shots and symptoms did not improve.  She had a NCV-EMG performed on 11/12/14, which was normal.  PAST MEDICAL HISTORY: Past Medical History  Diagnosis Date  . Malignant melanoma     left arm- wide excision  . Skin cancer     MEDICATIONS: Current Outpatient Prescriptions on File Prior to Visit  Medication Sig Dispense Refill  . gabapentin (NEURONTIN) 300 MG capsule 300 mg  am and 600 mg HS 90 capsule 6  . IUD'S IU by Intrauterine route.       No current facility-administered medications on file prior to visit.    ALLERGIES: Allergies  Allergen Reactions  . Penicillins     REACTION: As a small child does not remember exact reaction.    FAMILY HISTORY: Family History  Problem Relation Age of Onset  . Coronary artery disease Maternal Grandfather   . Hypertension Paternal Grandfather   . Nephrolithiasis Mother     SOCIAL HISTORY: Social History   Social History  . Marital Status: Married    Spouse Name: N/A  . Number of Children: N/A  . 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 Smoking status: Never Smoker   . Smokeless  tobacco: Never Used  . Alcohol Use: 0.0 oz/week    0 Standard drinks or equivalent per week     Comment: Occ  . Drug Use: No  . Sexual Activity:    Partners: Male   Other Topics Concern  . Not on file   Social History Narrative   Exercises regularly    REVIEW OF SYSTEMS: Constitutional: No fevers, chills, or sweats, no generalized fatigue, change in appetite Eyes: No visual changes, double vision, eye pain Ear, nose and throat: No hearing loss, ear pain, nasal congestion, sore throat Cardiovascular: No chest pain, palpitations Respiratory:  No shortness of  breath at rest or with exertion, wheezes GastrointestinaI: No nausea, vomiting, diarrhea, abdominal pain, fecal incontinence Genitourinary:  No dysuria, urinary retention or frequency Musculoskeletal:  No neck pain, back pain Integumentary: No rash, pruritus, skin lesions Neurological: as above Psychiatric: No depression, insomnia, anxiety Endocrine: No palpitations, fatigue, diaphoresis, mood swings, change in appetite, change in weight, increased thirst Hematologic/Lymphatic:  No anemia, purpura, petechiae. Allergic/Immunologic: no itchy/runny eyes, nasal congestion, recent allergic reactions, rashes  PHYSICAL EXAM: Filed Vitals:   03/24/15 0928  BP: 120/78  Pulse: 76   General: No acute distress.  Patient appears well-groomed.   Head:  Normocephalic/atraumatic Eyes:  Fundoscopic exam unremarkable without vessel changes, exudates, hemorrhages or papilledema. Neck: supple, no paraspinal tenderness, full range of motion Heart:  Regular rate and rhythm Lungs:  Clear to auscultation bilaterally Back: No paraspinal tenderness Neurological Exam: alert and oriented to person, place, and time. Attention span and concentration intact, recent and remote memory intact, fund of knowledge intact.  Speech fluent and not dysarthric, language intact.  CN II-XII intact. Fundoscopic exam unremarkable without vessel changes, exudates, hemorrhages or papilledema.  Bulk and tone normal, muscle strength 5/5 throughout.  Sensation to light touch, temperature and vibration intact.  Deep tendon reflexes 2+ throughout, toes downgoing.  Finger to nose and heel to shin testing intact.  Gait normal.  IMPRESSION: Hot sensation in palms and feet.  Consider small fiber neuropathy.  Undergoing hematologic workup for polycythemia vera, which may contribute to these symptoms.  PLAN: Increase gabapentin to 600mg  in afternoon and 900mg  at night.  She will call us in one week with update. Follow up in 3 months.  Metta Clines, DO  CC:  Loura Pardon, MD

## 2015-03-24 NOTE — Patient Instructions (Signed)
Increase gabapentin 300mg  capsules to 600mg  in afternoon and 900mg  at night.  Call in one week with update Follow up in 3 months.

## 2015-03-30 ENCOUNTER — Encounter: Payer: Self-pay | Admitting: *Deleted

## 2015-03-30 NOTE — Progress Notes (Signed)
No show letter sent for missed appointment on 03/22/2015

## 2015-04-01 ENCOUNTER — Inpatient Hospital Stay: Payer: 59 | Attending: Hematology and Oncology | Admitting: Hematology and Oncology

## 2015-04-01 VITALS — BP 120/75 | HR 89 | Temp 99.2°F | Ht 64.0 in | Wt 144.8 lb

## 2015-04-01 DIAGNOSIS — M5382 Other specified dorsopathies, cervical region: Secondary | ICD-10-CM | POA: Insufficient documentation

## 2015-04-01 DIAGNOSIS — R208 Other disturbances of skin sensation: Secondary | ICD-10-CM | POA: Diagnosis present

## 2015-04-01 DIAGNOSIS — R937 Abnormal findings on diagnostic imaging of other parts of musculoskeletal system: Secondary | ICD-10-CM

## 2015-04-01 DIAGNOSIS — Z79899 Other long term (current) drug therapy: Secondary | ICD-10-CM | POA: Diagnosis not present

## 2015-04-01 DIAGNOSIS — Z85828 Personal history of other malignant neoplasm of skin: Secondary | ICD-10-CM | POA: Diagnosis not present

## 2015-04-01 DIAGNOSIS — G629 Polyneuropathy, unspecified: Secondary | ICD-10-CM

## 2015-04-01 DIAGNOSIS — R6889 Other general symptoms and signs: Secondary | ICD-10-CM

## 2015-04-01 NOTE — Progress Notes (Signed)
[facility] day:  04/01/2015   Chief Complaint: Caitlin Ward is a 34 y.o. female with unexplained sensation of heat in her hands and feet as well as decreased T1 signal on cervical spine MRI who is seen for review of work-up and discussion regarding direction of therapy.  HPI:  The patient  was last seen in the medical oncology clinic on 03/18/2015.  At that time, she was seen for initial consultation regarding decreased T1 signal on a cervical MRI.  Scans had been performed secondary to persistent feeling of heat in her hands and feet since spring of 2015.  She denies any pain, numbness or weakness.  She denies any skin discoloration. Sensation was worse at night. She wears ice packs at night.  The patient has undergone an extensive work-up.  Labs on 10/19/2014 included a 24-hour urine for immunoelectrophoresis and light chains. There was no monoclonal spike.  Kappa free light chains were 9.15, lambda free light chains 1.92, with a ratio 4.77 (normal). Serum protein electrophoresis revealed no monoclonal protein.  Antiextractable nuclear antigen was negative. ANA was negative. Sedimentation rate was 6 with a C-reactive protein of 4.4 (normal). B12 was 220 (211-946).  Labs on 12/22/2014 revealed a normal B6. HIV was negative. Labs on 02/08/2015 revealed a hematocrit of 43.4 hemoglobin 14.5, MCV 89, platelets were 284,000, white count 6700 with an ANC of 4200. Ferritin was 38.  Labs on 03/18/2015 revealed a hematocrit 37.6, hemoglobin 13.1, MCV 86, platelets 336,000 white count 7700 with an ANC of 5100. Comprehensive metabolic panel included a creatinine of 0.64. Folate was 6.5. SPEP revealed no monoclonal protein. Kappa free light chains were 11.78, lambda free light chains 12.38 with a normal ration (0.95).  Symptomatically, she notes no new complaints. Her only symptom is a sensation of heat in her hands more than her feet.  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  She notes no improvement in symptoms.  She denies any history of tick bites or color changes distally in her hands and feet.  Past Medical History  Diagnosis Date  . Malignant melanoma     left arm- wide excision  . Skin cancer     Past Surgical History  Procedure Laterality Date  . Breast enhancement surgery      Family History  Problem Relation Age of Onset  . Coronary artery disease Maternal Grandfather   . Hypertension Paternal Grandfather   . Nephrolithiasis Mother     Social History:  reports that she has never smoked. She has never used smokeless tobacco. She reports that she drinks alcohol. She reports that she does not use illicit drugs.  The patient is alone today.  Allergies:  Allergies  Allergen Reactions  . Penicillins     REACTION: As a small child does not remember exact reaction.    Current Medications: Current Outpatient Prescriptions  Medication Sig Dispense Refill  . gabapentin (NEURONTIN) 300 MG capsule 300 mg  am and 600 mg HS 90 capsule 6  . IUD'S IU by Intrauterine route.       No current facility-administered medications for this visit.   Review of Systems:  GENERAL:  Energy level is good "for the most part".  No fevers, sweats or weight loss. PERFORMANCE STATUS (ECOG):  1 HEENT:  No visual changes, runny nose, sore throat, mouth sores or tenderness. Lungs: No shortness of breath or cough.  No hemoptysis. Cardiac:  No chest pain,  palpitations, orthopnea, or PND. GI:  No nausea, vomiting, diarrhea, constipation, melena or hematochezia. GU:  No urgency, frequency, dysuria, or hematuria. Musculoskeletal:  No back pain.  No joint pain.  No muscle tenderness. Extremities:  No pain or swelling. Skin:  No rashes or skin changes.  No tick bites. Neuro:  Heat sensation in hands > feet.  No headache, numbness or weakness, balance or coordination issues. Endocrine:  No diabetes, thyroid issues, hot flashes or night  sweats. Psych:  No mood changes, depression or anxiety. Pain:  No focal pain. Review of systems:  All other systems reviewed and found to be negative.  Physical Exam: Blood pressure 120/75, pulse 89, temperature 99.2 F (37.3 C), temperature source Oral, height _0  (1.626 m), weight 144 lb 13.5 oz (65.7 kg). GENERAL:  Well developed, well nourished, sitting comfortably in the exam room in no acute distress. MENTAL STATUS:  Alert and oriented to person, place and time. HEAD:  Long blonde hair.  Normocephalic, atraumatic, face symmetric, no Cushingoid features. EYES:  No conjunctivitis or scleral icterus. PSYCH:  Appropriate.  No visits with results within 3 Day(s) from this visit. Latest known visit with results is:  Office Visit on 02/05/2015  Component Date Value Ref Range Status  . WBC 02/08/2015 6.7  3.4 - 10.8 x10E3/uL Final  . RBC 02/08/2015 4.89  3.77 - 5.28 x10E6/uL Final  . Hemoglobin 02/08/2015 14.5  11.1 - 15.9 g/dL Final  . Hematocrit 02/08/2015 43.4  34.0 - 46.6 % Final  . MCV 02/08/2015 89  79 - 97 fL Final  . MCH 02/08/2015 29.7  26.6 - 33.0 pg Final  . MCHC 02/08/2015 33.4  31.5 - 35.7 g/dL Final  . RDW 02/08/2015 13.2  12.3 - 15.4 % Final  . Platelets 02/08/2015 284  150 - 379 x10E3/uL Final  . Neutrophils 02/08/2015 63   Final  . Lymphs 02/08/2015 32   Final  . Monocytes 02/08/2015 4   Final  . Eos 02/08/2015 1   Final  . Basos 02/08/2015 0   Final  . Neutrophils Absolute 02/08/2015 4.2  1.4 - 7.0 x10E3/uL Final  . Lymphocytes Absolute 02/08/2015 2.1  0.7 - 3.1 x10E3/uL Final  . Monocytes Absolute 02/08/2015 0.2  0.1 - 0.9 x10E3/uL Final  . EOS (ABSOLUTE) 02/08/2015 0.1  0.0 - 0.4 x10E3/uL Final  . Basophils Absolute 02/08/2015 0.0  0.0 - 0.2 x10E3/uL Final  . Immature Granulocytes 02/08/2015 0   Final  . Immature Grans (Abs) 02/08/2015 0.0  0.0 - 0.1 x10E3/uL Final  . Ferritin 02/08/2015 38  15 - 150 ng/mL Final    Assessment:  Caitlin Ward is a 34  y.o. female  With a sensation of heat in her hands and feet since the spring of 2016.  Cervical spine MRI on 01/29/2015 revealed mild diffuse decreased T1 marrow signal possibly related to anemia or hypoxia.  She has a history of melanoma of the left arm s/p wide excision 7 years ago.  She has undergone extensive testing from 10/19/2014 until 03/18/2015.  Normal studies include:  CBC with diff, CMP, SPEP, free light chains, UPEP, urin free light chains, B6, folate, HIV testing, ESR, CRP, ANA, antiextractable nuclear antigen, celiac disease panel.  Initial B12 was low normal (220) with a repeat of 343 on 03/18/2015.  By her report supplimental B12 was not helpful.  There is no evidence of anemia, a monoclonal gammopathy, vasculitis, Raynaud's or Sjogren's.  No evidence for amyloid.  Doubt paraneoplastic syndrome.  Plan: 1. Review work-up to date.  No apparent etiology. 2. Discuss consideration of methylmalonic acid (MMA) testing given low normal B12 and possible B12 deficiency. 3. Discuss consideration of paraneoplastic panel. 4. Discuss at tumor board. 5. Discuss consideration of second opinion at [facility]. 6. Contact patient regarding tumor board discussions [phone]). 7. RTC prn or if additional testing performed.  Addendum:  Reviewed with radiology MRI images, non specific.  Unclear if related to current symptoms.  Could consider PET scan.    Lequita Asal, MD  04/01/2015, 11:16 PM

## 2015-04-01 NOTE — Progress Notes (Signed)
Patient here for follow up.Complaints of heat sensation in both hands and feet.

## 2015-04-01 NOTE — Progress Notes (Signed)
[facility] day:  03/18/2015  Chief Complaint: Caitlin Ward is a 34 y.o. female  who is referred by Dr Loura Pardon for an abnormal MRI.  HPI:  The patient notes a persistent feeling of heat in her hands and feet since spring of 2015.  She denies any pain, numbness or weakness.  She denies any skin discoloration. Sensation is worse at night. She often wears ice packs at night.  She underwent cervical spine MRI on 01/29/2015. Imaging studies revealed left paramedial disc protrusion at C5-6 with mild left central canal stenosis. There was mild diffuse decreased T1 signal signal possibly related to borderline anemia versus chronic hypoxia.  The patient has undergone testing. CBC on 08/10/2014 revealed a hematocrit of 43.4, hemoglobin 14.5, it was 284,000, white count 6700 with an ANC of 4200. Differential was unremarkable. Additional labs included a ferritin of 38.  Labs from [facility] have included the following negative studies:  endomysial IgA, transglutaminase, HIV testing,and  vitamin B6.  B12 was 281 on 07/22/2013 and later 220 per patient.  She was started on vitamin B12 (shots then pills).  Symptoms have not improved.  She has been seen by neurology.  Nerve conduction studies have been performed.  She was started on gabapentin for a "small fiber neuropathy".  She has been on Neurontin for 5 weeks (2 weeks ago dose increased) without improvement in symptoms.  Past Medical History  Diagnosis Date  . Malignant melanoma     left arm- wide excision  . Skin cancer     Past Surgical History  Procedure Laterality Date  . Breast enhancement surgery      Family History  Problem Relation Age of Onset  . Coronary artery disease Maternal Grandfather   . Hypertension Paternal Grandfather   . Nephrolithiasis Mother     Social History:  reports that she has never smoked. She has never used smokeless tobacco. She reports that she drinks  alcohol. She reports that she does not use illicit drugs.  The patient is alone today.  Allergies:  Allergies  Allergen Reactions  . Penicillins     REACTION: As a small child does not remember exact reaction.    Current Medications: Current Outpatient Prescriptions  Medication Sig Dispense Refill  . gabapentin (NEURONTIN) 300 MG capsule 300 mg  am and 600 mg HS 90 capsule 6  . IUD'S IU by Intrauterine route.       No current facility-administered medications for this visit.   Review of Systems:  GENERAL:  Energy level is good "for the most part".  No fevers, sweats or weight loss. PERFORMANCE STATUS (ECOG):  1 HEENT:  No visual changes, runny nose, sore throat, mouth sores or tenderness. Lungs: No shortness of breath or cough.  No hemoptysis. Cardiac:  No chest pain, palpitations, orthopnea, or PND. GI:  No nausea, vomiting, diarrhea, constipation, melena or hematochezia. GU:  No urgency, frequency, dysuria, or hematuria. Musculoskeletal:  No back pain.  No joint pain.  No muscle tenderness. Extremities:  No pain or swelling. Skin:  No rashes or skin changes. Neuro:  Heat sensation in hands > feet.  No headache, numbness or weakness, balance or coordination issues. Endocrine:  No diabetes, thyroid issues, hot flashes or night sweats. Psych:  No mood changes, depression or anxiety. Pain:  No focal pain. Review of systems:  All other systems reviewed and found to be negative.  Physical Exam: Blood pressure 107/71, pulse 80, temperature  96.8 F (36 C), temperature source Tympanic, resp. rate 18, height 5\' 4"  (1.626 m), weight 143 lb 11.8 oz (65.2 kg). GENERAL:  Well developed, well nourished, sitting comfortably in the exam room in no acute distress. MENTAL STATUS:  Alert and oriented to person, place and time. HEAD:  Long blonde hair.  Normocephalic, atraumatic, face symmetric, no Cushingoid features. EYES:  Pupils equal round and reactive to light and accomodation.  No  conjunctivitis or scleral icterus. ENT:  Oropharynx clear without lesion.  Tongue normal. Mucous membranes moist.  RESPIRATORY:  Clear to auscultation without rales, wheezes or rhonchi. CARDIOVASCULAR:  Regular rate and rhythm without murmur, rub or gallop. ABDOMEN:  Soft, non-tender, with active bowel sounds, and no hepatosplenomegaly.  No masses. SKIN:  No rashes, ulcers or lesions. EXTREMITIES: No edema, no skin discoloration or tenderness.  No palpable cords. LYMPH NODES: No palpable cervical, supraclavicular, axillary or inguinal adenopathy  NEUROLOGICAL: Unremarkable. PSYCH:  Appropriate.  No visits with results within 3 Day(s) from this visit. Latest known visit with results is:  Office Visit on 02/05/2015  Component Date Value Ref Range Status  . WBC 02/08/2015 6.7  3.4 - 10.8 x10E3/uL Final  . RBC 02/08/2015 4.89  3.77 - 5.28 x10E6/uL Final  . Hemoglobin 02/08/2015 14.5  11.1 - 15.9 g/dL Final  . Hematocrit 02/08/2015 43.4  34.0 - 46.6 % Final  . MCV 02/08/2015 89  79 - 97 fL Final  . MCH 02/08/2015 29.7  26.6 - 33.0 pg Final  . MCHC 02/08/2015 33.4  31.5 - 35.7 g/dL Final  . RDW 02/08/2015 13.2  12.3 - 15.4 % Final  . Platelets 02/08/2015 284  150 - 379 x10E3/uL Final  . Neutrophils 02/08/2015 63   Final  . Lymphs 02/08/2015 32   Final  . Monocytes 02/08/2015 4   Final  . Eos 02/08/2015 1   Final  . Basos 02/08/2015 0   Final  . Neutrophils Absolute 02/08/2015 4.2  1.4 - 7.0 x10E3/uL Final  . Lymphocytes Absolute 02/08/2015 2.1  0.7 - 3.1 x10E3/uL Final  . Monocytes Absolute 02/08/2015 0.2  0.1 - 0.9 x10E3/uL Final  . EOS (ABSOLUTE) 02/08/2015 0.1  0.0 - 0.4 x10E3/uL Final  . Basophils Absolute 02/08/2015 0.0  0.0 - 0.2 x10E3/uL Final  . Immature Granulocytes 02/08/2015 0   Final  . Immature Grans (Abs) 02/08/2015 0.0  0.0 - 0.1 x10E3/uL Final  . Ferritin 02/08/2015 38  15 - 150 ng/mL Final    Assessment:  Caitlin Ward is a 34 y.o. female with a heat sensation  in her hands and feet since the spring of 2015.  Cervical spine MRI on 01/29/2015 revealed mild diffuse decreased T1 marrow signal felt possibly related to anemia or hypoxia.  She has a history of melanoma of the left arm s/p wide excision 7 years ago.  Symptomatically, she denies any other complaint.  She denies any B symptoms.  Exam is unremarkable.  Plan: 1. Discuss unclear significance of mild diffuse decreased T1 marrow signal.  Discuss limited work-up. 2. Review imaging studies with radiology. 3. [facility] slip for CBC with diff, CMP, B12, folate, SPEP, immunoglobulin levels.  4. Obtain results from neurology. 5. RTC in 2 weeks for review of work-up.   Lequita Asal, MD  03/18/2015

## 2015-04-04 ENCOUNTER — Encounter: Payer: Self-pay | Admitting: Hematology and Oncology

## 2015-04-06 ENCOUNTER — Telehealth: Payer: Self-pay | Admitting: *Deleted

## 2015-04-06 DIAGNOSIS — G629 Polyneuropathy, unspecified: Secondary | ICD-10-CM

## 2015-04-06 MED ORDER — PREGABALIN 75 MG PO CAPS: 75.0000 mg | ORAL_CAPSULE | Freq: Two times a day (BID) | ORAL | Status: DC

## 2015-04-06 NOTE — Telephone Encounter (Signed)
Patient called her Gabapentin is not working she is currently taking 600 mg at lunch and 900 mg at dinner. Please advise Call back number [phone]

## 2015-04-06 NOTE — Telephone Encounter (Signed)
Spoke with pt. Pt. Advised to stop Gabapentin and start Lyrica 75mg , which I have called in to the pharmacy. Patient advised that script has been called in.

## 2015-04-06 NOTE — Telephone Encounter (Signed)
I would start her on Lyrica 75mg  twice daily

## 2015-04-09 ENCOUNTER — Other Ambulatory Visit: Payer: Self-pay | Admitting: *Deleted

## 2015-04-09 ENCOUNTER — Telehealth: Payer: Self-pay | Admitting: Hematology and Oncology

## 2015-04-09 NOTE — Telephone Encounter (Signed)
Re:  Tumor board discussions  Discussed review of cervical spine MRI after tumor board with radiology.  Non-specific.abnormality.  Discussed with oncology.  Can purse paraneoplastic panel and MMA as discussed at last visit.  Patient requested those be sent to [facility] (she will pick up slip). Also suggested - PET scan.  Patient deferred.  Patient interested in second opinion at [facility].  Will try to find a colleague that will be appropriate (hematology versus neurology).   Lequita Asal, MD

## 2015-04-15 ENCOUNTER — Telehealth: Payer: Self-pay | Admitting: *Deleted

## 2015-04-15 MED ORDER — DULOXETINE HCL 30 MG PO CPEP: 30.0000 mg | ORAL_CAPSULE | Freq: Every day | ORAL | Status: DC

## 2015-04-15 NOTE — Telephone Encounter (Signed)
She can stop the Lyrica.  Instead, we can try Cymbalta, which is an antidepressant used for nerve pain.  Start 30mg  daily for 7 days, then increase to 60mg  daily.

## 2015-04-15 NOTE — Telephone Encounter (Signed)
Patient taking Lyrica now but she states that it is giving her a headache and her hands an feet are burning and tingling. She wants to go back to a modified dose of gabapentin or something els. Please advise Call back number [phone]

## 2015-04-15 NOTE — Telephone Encounter (Signed)
Patient notified and Rx sent to pharmacy patient will call in a few week to update [place] on the change

## 2015-06-29 ENCOUNTER — Ambulatory Visit: Payer: 59 | Admitting: Neurology

## 2015-10-24 ENCOUNTER — Telehealth: Payer: Self-pay | Admitting: Family Medicine

## 2015-10-24 DIAGNOSIS — Z0000 Encounter for general adult medical examination without abnormal findings: Secondary | ICD-10-CM

## 2015-10-24 NOTE — Telephone Encounter (Signed)
-----   Message from Ellamae Sia sent at 10/15/2015 11:36 AM EDT ----- Regarding: lab orders for Wednesday, 3.29.17 Patient is scheduled for CPX labs, please order future labs, Thanks , Karna Christmas

## 2015-10-27 ENCOUNTER — Other Ambulatory Visit (INDEPENDENT_AMBULATORY_CARE_PROVIDER_SITE_OTHER): Payer: 59

## 2015-10-27 DIAGNOSIS — Z0000 Encounter for general adult medical examination without abnormal findings: Secondary | ICD-10-CM

## 2015-10-28 LAB — CBC WITH DIFFERENTIAL/PLATELET
Basophils Absolute: 0 10*3/uL (ref 0.0–0.2)
Basos: 0 %
EOS (ABSOLUTE): 0.2 10*3/uL (ref 0.0–0.4)
Eos: 4 %
HEMOGLOBIN: 11.9 g/dL (ref 11.1–15.9)
Hematocrit: 35.7 % (ref 34.0–46.6)
IMMATURE GRANULOCYTES: 0 %
Immature Grans (Abs): 0 10*3/uL (ref 0.0–0.1)
Lymphocytes Absolute: 1.8 10*3/uL (ref 0.7–3.1)
Lymphs: 30 %
MCH: 30.1 pg (ref 26.6–33.0)
MCHC: 33.3 g/dL (ref 31.5–35.7)
MCV: 90 fL (ref 79–97)
MONOCYTES: 7 %
Monocytes Absolute: 0.4 10*3/uL (ref 0.1–0.9)
NEUTROS PCT: 59 %
Neutrophils Absolute: 3.5 10*3/uL (ref 1.4–7.0)
Platelets: 320 10*3/uL (ref 150–379)
RBC: 3.96 x10E6/uL (ref 3.77–5.28)
RDW: 12.9 % (ref 12.3–15.4)
WBC: 5.9 10*3/uL (ref 3.4–10.8)

## 2015-10-28 LAB — COMPREHENSIVE METABOLIC PANEL
ALBUMIN: 4 g/dL (ref 3.5–5.5)
ALT: 11 IU/L (ref 0–32)
AST: 14 IU/L (ref 0–40)
Albumin/Globulin Ratio: 1.7 (ref 1.2–2.2)
Alkaline Phosphatase: 41 IU/L (ref 39–117)
BUN/Creatinine Ratio: 16 (ref 8–20)
BUN: 11 mg/dL (ref 6–20)
Bilirubin Total: <0.2 mg/dL (ref 0.0–1.2)
CO2: 22 mmol/L (ref 18–29)
CREATININE: 0.69 mg/dL (ref 0.57–1.00)
Calcium: 9.2 mg/dL (ref 8.7–10.2)
Chloride: 102 mmol/L (ref 96–106)
GFR calc Af Amer: 131 mL/min/{1.73_m2} (ref >59)
GFR, EST NON AFRICAN AMERICAN: 114 mL/min/{1.73_m2} (ref >59)
GLUCOSE: 94 mg/dL (ref 65–99)
Globulin, Total: 2.4 g/dL (ref 1.5–4.5)
Potassium: 5 mmol/L (ref 3.5–5.2)
SODIUM: 142 mmol/L (ref 134–144)
Total Protein: 6.4 g/dL (ref 6.0–8.5)

## 2015-10-28 LAB — LIPID PANEL
CHOL/HDL RATIO: 5.4 ratio — AB (ref 0.0–4.4)
Cholesterol, Total: 194 mg/dL (ref 100–199)
HDL: 36 mg/dL — AB (ref >39)
LDL Calculated: 131 mg/dL — ABNORMAL HIGH (ref 0–99)
Triglycerides: 137 mg/dL (ref 0–149)
VLDL Cholesterol Cal: 27 mg/dL (ref 5–40)

## 2015-10-28 LAB — TSH: TSH: 1.58 u[IU]/mL (ref 0.450–4.500)

## 2015-11-02 ENCOUNTER — Encounter: Payer: Self-pay | Admitting: Family Medicine

## 2015-11-02 ENCOUNTER — Other Ambulatory Visit (HOSPITAL_COMMUNITY)
Admission: RE | Admit: 2015-11-02 | Discharge: 2015-11-02 | Disposition: A | Discharge status: Home / Self Care | Payer: 59 | Source: Ambulatory Visit | Attending: Family Medicine | Admitting: Family Medicine

## 2015-11-02 ENCOUNTER — Ambulatory Visit (INDEPENDENT_AMBULATORY_CARE_PROVIDER_SITE_OTHER): Payer: 59 | Admitting: Family Medicine

## 2015-11-02 VITALS — BP 114/78 | HR 72 | Temp 98.1°F | Ht 65.0 in | Wt 142.0 lb

## 2015-11-02 DIAGNOSIS — G629 Polyneuropathy, unspecified: Secondary | ICD-10-CM | POA: Diagnosis not present

## 2015-11-02 DIAGNOSIS — Z1151 Encounter for screening for human papillomavirus (HPV): Secondary | ICD-10-CM | POA: Diagnosis present

## 2015-11-02 DIAGNOSIS — E785 Hyperlipidemia, unspecified: Secondary | ICD-10-CM | POA: Diagnosis not present

## 2015-11-02 DIAGNOSIS — Z01419 Encounter for gynecological examination (general) (routine) without abnormal findings: Secondary | ICD-10-CM | POA: Insufficient documentation

## 2015-11-02 DIAGNOSIS — Z0000 Encounter for general adult medical examination without abnormal findings: Secondary | ICD-10-CM | POA: Diagnosis not present

## 2015-11-02 LAB — HM PAP SMEAR: HM PAP: NORMAL

## 2015-11-02 NOTE — Assessment & Plan Note (Signed)
Routine exam and pap  No c/o Has IUD  Declines std screen Pap done

## 2015-11-02 NOTE — Patient Instructions (Signed)
Take care of yourself  Keep using sunscreen Exercise when you can  Labs are stable Continue regular dermatology visits  For cholesterol when you can avoid red meat/ fried foods/ egg yolks/ fatty breakfast meats/ butter, cheese and high fat dairy/ and shellfish

## 2015-11-02 NOTE — Progress Notes (Signed)
Pre visit review using our clinic review tool, if applicable. No additional management support is needed unless otherwise documented below in the visit note. 

## 2015-11-02 NOTE — Assessment & Plan Note (Signed)
LDL in 130s Disc goals for lipids and reasons to control them Rev labs with pt Rev low sat fat diet in detail

## 2015-11-02 NOTE — Progress Notes (Signed)
Subjective:    Patient ID: Ginger Organ, female    DOB: 24-Oct-1980, 35 y.o.   MRN: [id number]  HPI Here for health maintenance exam and to review chronic medical problems    Wt is down 2 lb with bmi of 23 Just started back her exercise -has a bike that she uses inside  Some running on the weekends  Not a lot of time   Not trying to loose weight Eating a healthy diet   Flu vaccine -unsure if she got one this year - rest of the family got it   Pap 2/16 normal Menses -none  IUD- just had the new one put in 1 1/2 years ago  Does not need STD tests    Td 6/11   HIV screen neg 5/16  Derm f/u-saw Dr Nicole Kindred last month - watching a few spots-no bx lately  Uses sunblock regularly   Still seeing neurology for her ? Neuropathy area  Trying some accu puncture -with Dr Sabas Sous 3 different medications for the symptoms -no help  Saw hematology for slt abn on MRI - had rev prev and tumor board rev , and also w/u was neg   Cholesterol Lab Results  Component Value Date   CHOL 194 10/27/2015   CHOL 184 09/23/2014   CHOL 195 09/09/2013   Lab Results  Component Value Date   HDL 36* 10/27/2015   HDL 39* 09/23/2014   HDL 38* 09/09/2013   Lab Results  Component Value Date   LDLCALC 131* 10/27/2015   LDLCALC 114* 09/23/2014   LDLCALC 136* 09/09/2013   Lab Results  Component Value Date   TRIG 137 10/27/2015   TRIG 154* 09/23/2014   TRIG 104 09/09/2013   Lab Results  Component Value Date   CHOLHDL 5.4* 10/27/2015   CHOLHDL 4.7* 09/23/2014   CHOLHDL 5.1* 09/09/2013   No results found for: LDLDIRECT LDL is up slightly  Not exercising as much   Patient Active Problem List   Diagnosis Date Noted  . Abnormal MRI, cervical spine 02/05/2015  . Small fiber neuropathy ([place]) 11/18/2014  . Temperature intolerance 09/28/2014  . Hyperlipidemia, mild 09/19/2013  . Neuropathic pain of hand 07/22/2013  . Sensation of feeling hot 07/22/2013  . Encounter for routine  gynecological examination 07/30/2012  . Routine general medical examination at a health care facility 07/21/2012  . Joint pain 05/28/2012  . Myofascial pain 05/28/2012  . Pain of right lower leg 05/28/2012  . Hepatic lesion 03/07/2012  . Hematuria, microscopic 10/23/2011  . Anxiety and depression 03/28/2011  . SLEEP DISORDER 04/27/2010  . DYSMENORRHEA 02/27/2007   Past Medical History  Diagnosis Date  . Malignant melanoma ([place])     left arm- wide excision  . Skin cancer    Past Surgical History  Procedure Laterality Date  . Breast enhancement surgery     Social History  Substance Use Topics  . Smoking status: Never Smoker   . Smokeless tobacco: Never Used  . Alcohol Use: 0.0 oz/week    0 Standard drinks or equivalent per week     Comment: Occ   Family History  Problem Relation Age of Onset  . Coronary artery disease Maternal Grandfather   . Hypertension Paternal Grandfather   . Nephrolithiasis Mother    Allergies  Allergen Reactions  . Penicillins     REACTION: As a small child does not remember exact reaction.   Current Outpatient Prescriptions on File Prior to Visit  Medication Sig  Dispense Refill  . IUD'S IU by Intrauterine route.       No current facility-administered medications on file prior to visit.     Review of Systems Review of Systems  Constitutional: Negative for fever, appetite change, fatigue and unexpected weight change.  Eyes: Negative for pain and visual disturbance.  Respiratory: Negative for cough and shortness of breath.   Cardiovascular: Negative for cp or palpitations    Gastrointestinal: Negative for nausea, diarrhea and constipation.  Genitourinary: Negative for urgency and frequency.  Skin: Negative for pallor or rash   Neurological: Negative for weakness, light-headedness, numbness and headaches. pos for hot feet and hands at night  Hematological: Negative for adenopathy. Does not bruise/bleed easily.  Psychiatric/Behavioral:  Negative for dysphoric mood. The patient is not nervous/anxious.         Objective:   Physical Exam  Constitutional: She appears well-developed and well-nourished. No distress.  Well appearing   HENT:  Head: Normocephalic and atraumatic.  Right Ear: External ear normal.  Left Ear: External ear normal.  Mouth/Throat: Oropharynx is clear and moist.  Eyes: Conjunctivae and EOM are normal. Pupils are equal, round, and reactive to light. No scleral icterus.  Neck: Normal range of motion. Neck supple. No JVD present. Carotid bruit is not present. No thyromegaly present.  Cardiovascular: Normal rate, regular rhythm, normal heart sounds and intact distal pulses.  Exam reveals no gallop.   Pulmonary/Chest: Effort normal and breath sounds normal. No respiratory distress. She has no wheezes. She exhibits no tenderness.  Abdominal: Soft. Bowel sounds are normal. She exhibits no distension, no abdominal bruit and no mass. There is no tenderness.  Genitourinary: Vagina normal and uterus normal. No breast swelling, tenderness, discharge or bleeding. There is no rash, tenderness or lesion on the right labia. There is no rash, tenderness or lesion on the left labia. Uterus is not enlarged and not tender. Cervix exhibits no motion tenderness, no discharge and no friability. Right adnexum displays no mass, no tenderness and no fullness. Left adnexum displays no mass, no tenderness and no fullness. No bleeding in the vagina. No vaginal discharge found.  bilat nl app breast implants Breast exam: No mass, nodules, thickening, tenderness, bulging, retraction, inflamation, nipple discharge or skin changes noted.  No axillary or clavicular LA.      Musculoskeletal: Normal range of motion. She exhibits no edema or tenderness.  Lymphadenopathy:    She has no cervical adenopathy.  Neurological: She is alert. She has normal reflexes. No cranial nerve deficit. She exhibits normal muscle tone. Coordination normal.  Skin:  Skin is warm and dry. No rash noted. No erythema. No pallor.  Psychiatric: She has a normal mood and affect.          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[place])    No improvement in several medicines tried with neuro  Going to accu puncture now to see if it will help        Other   Encounter for routine gynecological examination    Routine exam and pap  No c/o Has IUD  Declines std screen Pap done      Relevant Orders   Cytology - PAP   Hyperlipidemia, mild    LDL in 130s Disc goals for lipids and reasons to control them Rev labs with pt Rev low sat fat diet in detail  Routine general medical examination at a health care facility - Primary    Reviewed health habits including diet and exercise and skin cancer prevention Reviewed appropriate screening tests for age  Also reviewed health mt list, fam hx and immunization status , as well as social and family history   See HPI Labs reviewed Will work on low sat fat diet and exercise

## 2015-11-02 NOTE — Assessment & Plan Note (Signed)
No improvement in several medicines tried with neuro  Going to accu puncture now to see if it will help

## 2015-11-02 NOTE — Assessment & Plan Note (Signed)
Reviewed health habits including diet and exercise and skin cancer prevention Reviewed appropriate screening tests for age  Also reviewed health mt list, fam hx and immunization status , as well as social and family history   See HPI Labs reviewed Will work on low sat fat diet and exercise

## 2015-11-03 LAB — CYTOLOGY - PAP

## 2015-11-05 ENCOUNTER — Encounter: Payer: Self-pay | Admitting: *Deleted

## 2016-06-26 ENCOUNTER — Telehealth: Payer: Self-pay

## 2016-06-26 NOTE — Telephone Encounter (Signed)
Pt called to find out date of last pap smear; advised pt 11/02/15. Pt wanted to know if she needed a pap smear this year would that be done at [facility] scheduled 11/15/16. Advised pt yes if needed the pap smear can be done at the [facility] scheduled on 11/15/16. Pt voiced understanding.Nothing further needed.

## 2016-11-01 ENCOUNTER — Telehealth: Payer: Self-pay | Admitting: Family Medicine

## 2016-11-01 DIAGNOSIS — Z0000 Encounter for general adult medical examination without abnormal findings: Secondary | ICD-10-CM

## 2016-11-01 NOTE — Telephone Encounter (Signed)
-----   Message from Ellamae Sia sent at 10/31/2016 11:34 AM EDT ----- Regarding: lab orders for Monday 4.16.18 Patient is scheduled for [facility] labs, please order future labs, Thanks , Karna Christmas

## 2016-11-13 ENCOUNTER — Other Ambulatory Visit (INDEPENDENT_AMBULATORY_CARE_PROVIDER_SITE_OTHER): Payer: 59

## 2016-11-13 DIAGNOSIS — Z0000 Encounter for general adult medical examination without abnormal findings: Secondary | ICD-10-CM

## 2016-11-13 NOTE — Addendum Note (Signed)
Addended by: Marchia Bond on: 11/13/2016 08:26 AM   Modules accepted: Orders

## 2016-11-14 LAB — COMPREHENSIVE METABOLIC PANEL
A/G RATIO: 1.7 (ref 1.2–2.2)
ALBUMIN: 4.3 g/dL (ref 3.5–5.5)
ALK PHOS: 43 IU/L (ref 39–117)
ALT: 16 IU/L (ref 0–32)
AST: 19 IU/L (ref 0–40)
BILIRUBIN TOTAL: 0.3 mg/dL (ref 0.0–1.2)
BUN / CREAT RATIO: 18 (ref 9–23)
BUN: 12 mg/dL (ref 6–20)
CHLORIDE: 101 mmol/L (ref 96–106)
CO2: 22 mmol/L (ref 18–29)
Calcium: 9.3 mg/dL (ref 8.7–10.2)
Creatinine, Ser: 0.67 mg/dL (ref 0.57–1.00)
GFR calc Af Amer: 132 mL/min/{1.73_m2} (ref >59)
GFR calc non Af Amer: 114 mL/min/{1.73_m2} (ref >59)
GLOBULIN, TOTAL: 2.5 g/dL (ref 1.5–4.5)
Glucose: 93 mg/dL (ref 65–99)
Potassium: 4.7 mmol/L (ref 3.5–5.2)
SODIUM: 138 mmol/L (ref 134–144)
Total Protein: 6.8 g/dL (ref 6.0–8.5)

## 2016-11-14 LAB — CBC WITH DIFFERENTIAL/PLATELET
BASOS ABS: 0 10*3/uL (ref 0.0–0.2)
Basos: 0 %
EOS (ABSOLUTE): 0.3 10*3/uL (ref 0.0–0.4)
EOS: 4 %
HEMATOCRIT: 37 % (ref 34.0–46.6)
Hemoglobin: 12.6 g/dL (ref 11.1–15.9)
Immature Grans (Abs): 0 10*3/uL (ref 0.0–0.1)
Immature Granulocytes: 0 %
LYMPHS ABS: 2 10*3/uL (ref 0.7–3.1)
Lymphs: 31 %
MCH: 29 pg (ref 26.6–33.0)
MCHC: 34.1 g/dL (ref 31.5–35.7)
MCV: 85 fL (ref 79–97)
MONOCYTES: 7 %
Monocytes Absolute: 0.5 10*3/uL (ref 0.1–0.9)
NEUTROS ABS: 3.8 10*3/uL (ref 1.4–7.0)
Neutrophils: 58 %
Platelets: 352 10*3/uL (ref 150–379)
RBC: 4.34 x10E6/uL (ref 3.77–5.28)
RDW: 12.9 % (ref 12.3–15.4)
WBC: 6.6 10*3/uL (ref 3.4–10.8)

## 2016-11-14 LAB — LIPID PANEL
Chol/HDL Ratio: 5.2 ratio — ABNORMAL HIGH (ref 0.0–4.4)
Cholesterol, Total: 206 mg/dL — ABNORMAL HIGH (ref 100–199)
HDL: 40 mg/dL (ref >39)
LDL Calculated: 139 mg/dL — ABNORMAL HIGH (ref 0–99)
Triglycerides: 137 mg/dL (ref 0–149)
VLDL Cholesterol Cal: 27 mg/dL (ref 5–40)

## 2016-11-14 LAB — TSH: TSH: 2.27 u[IU]/mL (ref 0.450–4.500)

## 2016-11-15 ENCOUNTER — Ambulatory Visit (INDEPENDENT_AMBULATORY_CARE_PROVIDER_SITE_OTHER): Payer: 59 | Admitting: Family Medicine

## 2016-11-15 ENCOUNTER — Encounter: Payer: Self-pay | Admitting: Family Medicine

## 2016-11-15 VITALS — BP 104/66 | HR 67 | Temp 98.2°F | Ht 64.25 in | Wt 141.5 lb

## 2016-11-15 DIAGNOSIS — Z0000 Encounter for general adult medical examination without abnormal findings: Secondary | ICD-10-CM | POA: Diagnosis not present

## 2016-11-15 DIAGNOSIS — E785 Hyperlipidemia, unspecified: Secondary | ICD-10-CM | POA: Diagnosis not present

## 2016-11-15 DIAGNOSIS — G629 Polyneuropathy, unspecified: Secondary | ICD-10-CM | POA: Diagnosis not present

## 2016-11-15 NOTE — Progress Notes (Signed)
Subjective:    Patient ID: Caitlin Ward, female    DOB: 1981-06-30, 36 y.o.   MRN: [id number]  HPI Here for health maintenance exam and to review chronic medical problems    Very busy-work and kids  A little stressed Overall doing very well   [place] weeks [place]- loved it  Will go to the beach in the summer    Just picked her running back up - a 5 K on sat  Not much in the winter   Wt Readings from Last 3 Encounters:  11/15/16 141 lb 8 oz (64.2 kg)  11/02/15 142 lb (64.4 kg)  04/01/15 144 lb 13.5 oz (65.7 kg)  keeping up with wt mt  Eats healthy sometimes  bmi 24.1  HIV screen neg 5/16  Does not desire any STD screening   Pap/gyn care : 4/17 neg pap with neg HPV test No hx of abn paps or gyn problems  Has an IUD- no periods  Self breast exam- no lumps   Flu vaccine - did not get  Tetanus vaccine 6/11  Personal hx of melanoma   Hx of neuropathy -about the same  Tried accupunct- no help  She lives with it    Hx of mild hyperlipidemia Lab Results  Component Value Date   CHOL 206 (H) 11/13/2016   CHOL 194 10/27/2015   CHOL 184 09/23/2014   Lab Results  Component Value Date   HDL 40 11/13/2016   HDL 36 (L) 10/27/2015   HDL 39 (L) 09/23/2014   Lab Results  Component Value Date   LDLCALC 139 (H) 11/13/2016   LDLCALC 131 (H) 10/27/2015   LDLCALC 114 (H) 09/23/2014   Lab Results  Component Value Date   TRIG 137 11/13/2016   TRIG 137 10/27/2015   TRIG 154 (H) 09/23/2014   Lab Results  Component Value Date   CHOLHDL 5.2 (H) 11/13/2016   CHOLHDL 5.4 (H) 10/27/2015   CHOLHDL 4.7 (H) 09/23/2014   No results found for: LDLDIRECT Mother has high cholesterol  She avoids breakfast meat  Some red meat - will use ground [place] Likes eggs and [place]  Likes fries and fried chicken nuggets-not often Avoids hamburger Loves cheese   Has [facility] wellness screening in July   Results for orders placed or performed in visit on 11/13/16    CBC with Differential/Platelet  Result Value Ref Range   WBC 6.6 3.4 - 10.8 x10E3/uL   RBC 4.34 3.77 - 5.28 x10E6/uL   Hemoglobin 12.6 11.1 - 15.9 g/dL   Hematocrit 37.0 34.0 - 46.6 %   MCV 85 79 - 97 fL   MCH 29.0 26.6 - 33.0 pg   MCHC 34.1 31.5 - 35.7 g/dL   RDW 12.9 12.3 - 15.4 %   Platelets 352 150 - 379 x10E3/uL   Neutrophils 58 Not Estab. %   Lymphs 31 Not Estab. %   Monocytes 7 Not Estab. %   Eos 4 Not Estab. %   Basos 0 Not Estab. %   Neutrophils Absolute 3.8 1.4 - 7.0 x10E3/uL   Lymphocytes Absolute 2.0 0.7 - 3.1 x10E3/uL   Monocytes Absolute 0.5 0.1 - 0.9 x10E3/uL   EOS (ABSOLUTE) 0.3 0.0 - 0.4 x10E3/uL   Basophils Absolute 0.0 0.0 - 0.2 x10E3/uL   Immature Granulocytes 0 Not Estab. %   Immature Grans (Abs) 0.0 0.0 - 0.1 x10E3/uL  Comprehensive metabolic panel  Result Value Ref Range   Glucose 93 65 -  99 mg/dL   BUN 12 6 - 20 mg/dL   Creatinine, Ser 0.67 0.57 - 1.00 mg/dL   GFR calc non Af Amer 114 >59 mL/min/1.73   GFR calc Af Amer 132 >59 mL/min/1.73   BUN/Creatinine Ratio 18 9 - 23   Sodium 138 134 - 144 mmol/L   Potassium 4.7 3.5 - 5.2 mmol/L   Chloride 101 96 - 106 mmol/L   CO2 22 18 - 29 mmol/L   Calcium 9.3 8.7 - 10.2 mg/dL   Total Protein 6.8 6.0 - 8.5 g/dL   Albumin 4.3 3.5 - 5.5 g/dL   Globulin, Total 2.5 1.5 - 4.5 g/dL   Albumin/Globulin Ratio 1.7 1.2 - 2.2   Bilirubin Total 0.3 0.0 - 1.2 mg/dL   Alkaline Phosphatase 43 39 - 117 IU/L   AST 19 0 - 40 IU/L   ALT 16 0 - 32 IU/L  TSH  Result Value Ref Range   TSH 2.270 0.450 - 4.500 uIU/mL  Lipid panel  Result Value Ref Range   Cholesterol, Total 206 (H) 100 - 199 mg/dL   Triglycerides 137 0 - 149 mg/dL   HDL 40 >39 mg/dL   VLDL Cholesterol Cal 27 5 - 40 mg/dL   LDL Calculated 139 (H) 0 - 99 mg/dL   Chol/HDL Ratio 5.2 (H) 0.0 - 4.4 ratio     Patient Active Problem List   Diagnosis Date Noted  . Abnormal MRI, cervical spine 02/05/2015  . 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 Temperature  intolerance 09/28/2014  . Hyperlipidemia, mild 09/19/2013  . Encounter for routine gynecological examination 07/30/2012  . Routine general medical examination at a health care facility 07/21/2012  . Myofascial pain 05/28/2012  . Hepatic lesion 03/07/2012  . Hematuria, microscopic 10/23/2011  . SLEEP DISORDER 04/27/2010  . DYSMENORRHEA 02/27/2007   Past Medical History:  Diagnosis Date  . Malignant melanoma ([place])    left arm- wide excision  . Skin cancer    Past Surgical History:  Procedure Laterality Date  . BREAST ENHANCEMENT SURGERY     Social History  Substance Use Topics  . Smoking status: Never Smoker  . Smokeless tobacco: Never Used  . Alcohol use 0.0 oz/week     Comment: Occ   Family History  Problem Relation Age of Onset  . Coronary artery disease Maternal Grandfather   . Hypertension Paternal Grandfather   . Nephrolithiasis Mother    Allergies  Allergen Reactions  . Penicillins     REACTION: As a small child does not remember exact reaction.   Current Outpatient Prescriptions on File Prior to Visit  Medication Sig Dispense Refill  . IUD'S IU by Intrauterine route.       No current facility-administered medications on file prior to visit.     Review of Systems Review of Systems  Constitutional: Negative for fever, appetite change, fatigue and unexpected weight change.  Eyes: Negative for pain and visual disturbance.  Respiratory: Negative for cough and shortness of breath.   Cardiovascular: Negative for cp or palpitations    Gastrointestinal: Negative for nausea, diarrhea and constipation.  Genitourinary: Negative for urgency and frequency.  Skin: Negative for pallor or rash   Neurological: Negative for weakness, light-headedness, numbness and headaches.  Hematological: Negative for adenopathy. Does not bruise/bleed easily.  Psychiatric/Behavioral: Negative for dysphoric mood. The patient is not nervous/anxious.  pos for some stressors         Objective:   Physical Exam  Constitutional: She appears well-developed and well-nourished.  No distress.  Well appearing   HENT:  Head: Normocephalic and atraumatic.  Right Ear: External ear normal.  Left Ear: External ear normal.  Mouth/Throat: Oropharynx is clear and moist.  Eyes: Conjunctivae and EOM are normal. Pupils are equal, round, and reactive to light. No scleral icterus.  Neck: Normal range of motion. Neck supple. No JVD present. Carotid bruit is not present. No thyromegaly present.  Cardiovascular: Normal rate, regular rhythm, normal heart sounds and intact distal pulses.  Exam reveals no gallop.   Pulmonary/Chest: Effort normal and breath sounds normal. No respiratory distress. She has no wheezes. She exhibits no tenderness.  Abdominal: Soft. Bowel sounds are normal. She exhibits no distension, no abdominal bruit and no mass. There is no tenderness.  Genitourinary: No breast swelling, tenderness, discharge or bleeding.  Genitourinary Comments: Breast implants noted bilateral  Breast exam: No mass, nodules, thickening, tenderness, bulging, retraction, inflamation, nipple discharge or skin changes noted.  No axillary or clavicular LA.      Musculoskeletal: Normal range of motion. She exhibits no edema or tenderness.  Lymphadenopathy:    She has no cervical adenopathy.  Neurological: She is alert. She has normal reflexes. No cranial nerve deficit. She exhibits normal muscle tone. Coordination normal.  Skin: Skin is warm and dry. No rash noted. No erythema. No pallor.  Stable lentigines and small brown nevi on back/trunk    Psychiatric: She has a normal mood and affect.          Assessment & Plan:   Problem List Items Addressed This Visit      Nervous and Auditory   Small fiber neuropathy    No change in symptoms  Pt tolerates w/o medication  Good health habits Labs reviewed         Other   Hyperlipidemia, mild    LDL is up  Disc goals for lipids and reasons  to control them Rev labs with pt Rev low sat fat diet in detail Suspect genetic high cholesterol /early  Given diet info Will continue to follow Statin in future if appropriate      Routine general medical examination at a health care facility - Primary    Reviewed health habits including diet and exercise and skin cancer prevention Reviewed appropriate screening tests for age  Also reviewed health mt list, fam hx and immunization status , as well as social and family history   See HPI Labs reviewed Interested in a baseline mammogram if it is covered by her insurance  Sees dermatology yearly-f/u next mo  Good health habits Pap was nl with neg HPV 1 y ago and she does not desire STD screen this year Disc low cholesterol diet  Enc flu shot in season

## 2016-11-15 NOTE — Assessment & Plan Note (Addendum)
Reviewed health habits including diet and exercise and skin cancer prevention Reviewed appropriate screening tests for age  Also reviewed health mt list, fam hx and immunization status , as well as social and family history   See HPI Labs reviewed Interested in a baseline mammogram if it is covered by her insurance  Sees dermatology yearly-f/u next mo  Good health habits Pap was nl with neg HPV 1 y ago and she does not desire STD screen this year Disc low cholesterol diet  Enc flu shot in season

## 2016-11-15 NOTE — Patient Instructions (Addendum)
If you are interested in a baseline mammogram - check with your insurance to see if it pays for one and let us know  For cholesterol: Avoid red meat/ fried foods/ egg yolks/ fatty breakfast meats/ butter, cheese and high fat dairy/ and shellfish   Try to avoid fast food/fried food/ excessive cheese Limit red meat to once per month   See the dermatologist as planned

## 2016-11-15 NOTE — Progress Notes (Signed)
Pre visit review using our clinic review tool, if applicable. No additional management support is needed unless otherwise documented below in the visit note. 

## 2016-11-15 NOTE — Assessment & Plan Note (Signed)
LDL is up  Disc goals for lipids and reasons to control them Rev labs with pt Rev low sat fat diet in detail Suspect genetic high cholesterol /early  Given diet info Will continue to follow Statin in future if appropriate

## 2016-11-15 NOTE — Assessment & Plan Note (Signed)
No change in symptoms  Pt tolerates w/o medication  Good health habits Labs reviewed

## 2018-01-03 ENCOUNTER — Ambulatory Visit: Payer: Managed Care, Other (non HMO) | Admitting: Family Medicine

## 2018-01-03 ENCOUNTER — Encounter: Payer: Self-pay | Admitting: Family Medicine

## 2018-01-03 VITALS — BP 104/66 | HR 70 | Temp 98.3°F | Ht 64.75 in | Wt 140.5 lb

## 2018-01-03 DIAGNOSIS — R112 Nausea with vomiting, unspecified: Secondary | ICD-10-CM | POA: Diagnosis not present

## 2018-01-03 DIAGNOSIS — R197 Diarrhea, unspecified: Secondary | ICD-10-CM | POA: Diagnosis not present

## 2018-01-03 MED ORDER — ONDANSETRON HCL 8 MG PO TABS: 8.0000 mg | ORAL_TABLET | Freq: Three times a day (TID) | ORAL | 1 refills | Status: DC | PRN

## 2018-01-03 NOTE — Assessment & Plan Note (Signed)
Since Sunday - (after eating fast food chicken sandwich)  Poss food bourne illness Disc imp of fluids to prevent dehydration/ water  Bland diet/ BRAT Also loose stools/bloating  Px zofran  Lab today  Stool tests today Update if not starting to improve in a week or if worsening  (watch for s/s of dehydration)

## 2018-01-03 NOTE — Patient Instructions (Addendum)
Labs today  Stool tests today   Keep hydrating- water and clear fluids  For eating - BRAT diet - bananas/rice/applesauce/toast  Bland/ small portions   zofran for nausea as needed

## 2018-01-03 NOTE — Progress Notes (Signed)
Subjective:    Patient ID: Ginger Organ, female    DOB: [birth year], 37 y.o.   MRN: [id number]  HPI Here for c/o of nausea   Wt Readings from Last 3 Encounters:  01/03/18 140 lb 8 oz (63.7 kg)  11/15/16 141 lb 8 oz (64.2 kg)  11/02/15 142 lb (64.4 kg)   23.56 kg/m   Ate [employer] chicken sandwich Sunday Vomited 3 h later  Nauseated every day since then  Comes in waves Has not been able to eat much - smoothie and banana once  Yesterday afternoon- bloating/ discomfort/gas /burping  Loose stools several times /nothing bad  No blood in stool   Was in hospital setting seeing sister in labor on Sunday as well  No recent abx    iud - not pregnant   Low on energy  Hungry - nothing sounds good to her   Fluid intake- has been able to drink water   No one else got sick but no one ate the same thing  No viral contacts that she knows of  No camping or travel   Just not getting better    Patient Active Problem List   Diagnosis Date Noted  . Nausea & vomiting 01/03/2018  . Diarrhea 01/03/2018  . Abnormal MRI, cervical spine 02/05/2015  . 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 Temperature intolerance 09/28/2014  . Hyperlipidemia, mild 09/19/2013  . Encounter for routine gynecological examination 07/30/2012  . Routine general medical examination at a health care facility 07/21/2012  . Myofascial pain 05/28/2012  . Hepatic lesion 03/07/2012  . Hematuria, microscopic 10/23/2011  . SLEEP DISORDER 04/27/2010  . DYSMENORRHEA 02/27/2007   Past Medical History:  Diagnosis Date  . Malignant melanoma ([place])    left arm- wide excision  . Skin cancer    Past Surgical History:  Procedure Laterality Date  . BREAST ENHANCEMENT SURGERY     Social History   Tobacco Use  . Smoking status: Never Smoker  . Smokeless tobacco: Never Used  Substance Use Topics  . Alcohol use: Yes    Alcohol/week: 0.0 oz    Comment: Occ  . Drug use: No   Family History  Problem Relation  Age of Onset  . Coronary artery disease Maternal Grandfather   . Hypertension Paternal Grandfather   . Nephrolithiasis Mother    Allergies  Allergen Reactions  . Penicillins     REACTION: As a small child does not remember exact reaction.   Current Outpatient Medications on File Prior to Visit  Medication Sig Dispense Refill  . IUD'S IU by Intrauterine route.       No current facility-administered medications on file prior to visit.     Review of Systems  Constitutional: Positive for fatigue. Negative for activity change, appetite change, fever and unexpected weight change.  HENT: Negative for congestion, ear pain, rhinorrhea, sinus pressure and sore throat.   Eyes: Negative for pain, redness and visual disturbance.  Respiratory: Negative for cough, shortness of breath and wheezing.   Cardiovascular: Negative for chest pain and palpitations.  Gastrointestinal: Positive for abdominal distention, nausea and vomiting. Negative for abdominal pain, anal bleeding, blood in stool, constipation, diarrhea and rectal pain.  Endocrine: Negative for polydipsia and polyuria.  Genitourinary: Negative for decreased urine volume, dysuria, frequency and urgency.  Musculoskeletal: Negative for arthralgias, back pain and myalgias.  Skin: Negative for pallor and rash.  Allergic/Immunologic: Negative for environmental allergies.  Neurological: Negative for dizziness, syncope and headaches.  Hematological: Negative for  adenopathy. Does not bruise/bleed easily.  Psychiatric/Behavioral: Negative for decreased concentration and dysphoric mood. The patient is not nervous/anxious.        Objective:   Physical Exam  Constitutional: She appears well-developed and well-nourished. No distress.  Well appearing but seems fatigued   HENT:  Head: Normocephalic and atraumatic.  Mouth/Throat: Oropharynx is clear and moist.  MMM  Eyes: Pupils are equal, round, and reactive to light. Conjunctivae and EOM are  normal. No scleral icterus.  Neck: Normal range of motion. Neck supple.  Cardiovascular: Normal rate, regular rhythm and normal heart sounds.  Pulmonary/Chest: Effort normal and breath sounds normal. No respiratory distress. She has no wheezes. She has no rales.  Abdominal: Soft. She exhibits no distension, no abdominal bruit, no pulsatile midline mass and no mass. There is no hepatosplenomegaly. There is tenderness in the left lower quadrant. There is no rigidity, no rebound, no guarding, no CVA tenderness, no tenderness at McBurney's point and negative Murphy's sign.  Very mild tenderness over bilat LQ- mostly on L  No rebound or guarding  bs are mildly increased (not high pitched or tinkling)    Musculoskeletal: She exhibits no edema.  Lymphadenopathy:    She has no cervical adenopathy.  Neurological: She is alert. No cranial nerve deficit. Coordination normal.  Skin: Skin is warm and dry. Capillary refill takes less than 2 seconds. No rash noted. She is not diaphoretic. No erythema. No pallor.  No jaundice  Psychiatric: She has a normal mood and affect.          Assessment & Plan:   Problem List Items Addressed This Visit      Digestive   Nausea & vomiting - Primary    Since Sunday - (after eating fast food chicken sandwich)  Poss food bourne illness Disc imp of fluids to prevent dehydration/ water  Bland diet/ BRAT Also loose stools/bloating  Px zofran  Lab today  Stool tests today Update if not starting to improve in a week or if worsening  (watch for s/s of dehydration)      Relevant Orders   CBC with Differential/Platelet   Comprehensive metabolic panel   Stool culture   C difficile Toxins A+B W/Rflx     Other   Diarrhea    Loose stools since Sunday with n/v Ate fast food chicken sandwich- timing is right for food bourne illness Also visited sister in hosp/cannot r/o c diff Stool tests and labs today  Disc imp of water intake to prevent dehydration  Bland  diet/ - BRAT Alert if symptoms worsen      Relevant Orders   CBC with Differential/Platelet   Comprehensive metabolic panel   Stool culture   C difficile Toxins A+B W/Rflx

## 2018-01-03 NOTE — Assessment & Plan Note (Signed)
Loose stools since Sunday with n/v Ate fast food chicken sandwich- timing is right for food bourne illness Also visited sister in hosp/cannot r/o c diff Stool tests and labs today  Disc imp of water intake to prevent dehydration  Criss Rosales diet/ - BRAT Alert if symptoms worsen

## 2018-01-04 LAB — CBC WITH DIFFERENTIAL/PLATELET
BASOS ABS: 0 10*3/uL (ref 0.0–0.2)
Basos: 0 %
EOS (ABSOLUTE): 0.1 10*3/uL (ref 0.0–0.4)
Eos: 1 %
Hematocrit: 38.5 % (ref 34.0–46.6)
Hemoglobin: 13.5 g/dL (ref 11.1–15.9)
IMMATURE GRANULOCYTES: 0 %
Immature Grans (Abs): 0 10*3/uL (ref 0.0–0.1)
LYMPHS: 29 %
Lymphocytes Absolute: 1.3 10*3/uL (ref 0.7–3.1)
MCH: 30.7 pg (ref 26.6–33.0)
MCHC: 35.1 g/dL (ref 31.5–35.7)
MCV: 88 fL (ref 79–97)
MONOCYTES: 9 %
Monocytes Absolute: 0.4 10*3/uL (ref 0.1–0.9)
NEUTROS PCT: 61 %
Neutrophils Absolute: 2.8 10*3/uL (ref 1.4–7.0)
Platelets: 346 10*3/uL (ref 150–450)
RBC: 4.4 x10E6/uL (ref 3.77–5.28)
RDW: 13.2 % (ref 12.3–15.4)
WBC: 4.6 10*3/uL (ref 3.4–10.8)

## 2018-01-04 LAB — COMPREHENSIVE METABOLIC PANEL
ALBUMIN: 4.4 g/dL (ref 3.5–5.5)
ALK PHOS: 46 IU/L (ref 39–117)
ALT: 12 IU/L (ref 0–32)
AST: 14 IU/L (ref 0–40)
Albumin/Globulin Ratio: 1.9 (ref 1.2–2.2)
BUN / CREAT RATIO: 10 (ref 9–23)
BUN: 6 mg/dL (ref 6–20)
Bilirubin Total: 0.4 mg/dL (ref 0.0–1.2)
CO2: 22 mmol/L (ref 20–29)
CREATININE: 0.61 mg/dL (ref 0.57–1.00)
Calcium: 9 mg/dL (ref 8.7–10.2)
Chloride: 101 mmol/L (ref 96–106)
GFR calc Af Amer: 135 mL/min/{1.73_m2} (ref >59)
GFR calc non Af Amer: 117 mL/min/{1.73_m2} (ref >59)
Globulin, Total: 2.3 g/dL (ref 1.5–4.5)
Glucose: 87 mg/dL (ref 65–99)
Potassium: 4.5 mmol/L (ref 3.5–5.2)
Sodium: 138 mmol/L (ref 134–144)
Total Protein: 6.7 g/dL (ref 6.0–8.5)

## 2018-01-04 NOTE — Addendum Note (Signed)
Addended by: Ellamae Sia on: 01/04/2018 08:55 AM   Modules accepted: Orders

## 2018-01-07 LAB — STOOL CULTURE: E coli, Shiga toxin Assay: NEGATIVE

## 2018-01-11 LAB — C DIFFICILE, CYTOTOXIN B

## 2018-01-11 LAB — C DIFFICILE TOXINS A+B W/RFLX: C DIFFICILE TOXINS A+B, EIA: NEGATIVE

## 2018-02-22 ENCOUNTER — Telehealth: Payer: Self-pay | Admitting: Family Medicine

## 2018-02-22 DIAGNOSIS — E785 Hyperlipidemia, unspecified: Secondary | ICD-10-CM

## 2018-02-22 DIAGNOSIS — Z0000 Encounter for general adult medical examination without abnormal findings: Secondary | ICD-10-CM

## 2018-02-22 NOTE — Telephone Encounter (Signed)
-----   Message from Ellamae Sia sent at 02/20/2018 10:21 AM EDT ----- Regarding: Lab orders for Monday, 7.29.19 Patient is scheduled for CPX labs, please order future labs, Thanks , Karna Christmas

## 2018-02-25 ENCOUNTER — Other Ambulatory Visit (INDEPENDENT_AMBULATORY_CARE_PROVIDER_SITE_OTHER): Payer: Managed Care, Other (non HMO)

## 2018-02-25 DIAGNOSIS — Z0000 Encounter for general adult medical examination without abnormal findings: Secondary | ICD-10-CM | POA: Diagnosis not present

## 2018-02-25 DIAGNOSIS — E785 Hyperlipidemia, unspecified: Secondary | ICD-10-CM

## 2018-02-25 NOTE — Addendum Note (Signed)
Addended by: Ellamae Sia on: 02/25/2018 08:31 AM   Modules accepted: Orders

## 2018-02-26 LAB — COMPREHENSIVE METABOLIC PANEL
A/G RATIO: 2 (ref 1.2–2.2)
ALBUMIN: 4.3 g/dL (ref 3.5–5.5)
ALT: 11 IU/L (ref 0–32)
AST: 17 IU/L (ref 0–40)
Alkaline Phosphatase: 43 IU/L (ref 39–117)
BILIRUBIN TOTAL: 0.3 mg/dL (ref 0.0–1.2)
BUN/Creatinine Ratio: 19 (ref 9–23)
BUN: 13 mg/dL (ref 6–20)
CHLORIDE: 102 mmol/L (ref 96–106)
CO2: 25 mmol/L (ref 20–29)
Calcium: 9.4 mg/dL (ref 8.7–10.2)
Creatinine, Ser: 0.69 mg/dL (ref 0.57–1.00)
GFR calc Af Amer: 130 mL/min/{1.73_m2} (ref >59)
GFR calc non Af Amer: 112 mL/min/{1.73_m2} (ref >59)
GLUCOSE: 102 mg/dL — AB (ref 65–99)
Globulin, Total: 2.2 g/dL (ref 1.5–4.5)
Potassium: 4.8 mmol/L (ref 3.5–5.2)
Sodium: 141 mmol/L (ref 134–144)
Total Protein: 6.5 g/dL (ref 6.0–8.5)

## 2018-02-26 LAB — CBC WITH DIFFERENTIAL/PLATELET
BASOS: 0 %
Basophils Absolute: 0 10*3/uL (ref 0.0–0.2)
EOS (ABSOLUTE): 0.2 10*3/uL (ref 0.0–0.4)
EOS: 4 %
HEMATOCRIT: 39.7 % (ref 34.0–46.6)
Hemoglobin: 13 g/dL (ref 11.1–15.9)
IMMATURE GRANS (ABS): 0 10*3/uL (ref 0.0–0.1)
Immature Granulocytes: 0 %
LYMPHS: 31 %
Lymphocytes Absolute: 1.9 10*3/uL (ref 0.7–3.1)
MCH: 29.9 pg (ref 26.6–33.0)
MCHC: 32.7 g/dL (ref 31.5–35.7)
MCV: 91 fL (ref 79–97)
MONOCYTES: 9 %
Monocytes Absolute: 0.5 10*3/uL (ref 0.1–0.9)
NEUTROS ABS: 3.4 10*3/uL (ref 1.4–7.0)
Neutrophils: 56 %
Platelets: 377 10*3/uL (ref 150–450)
RBC: 4.35 x10E6/uL (ref 3.77–5.28)
RDW: 13.2 % (ref 12.3–15.4)
WBC: 6 10*3/uL (ref 3.4–10.8)

## 2018-02-26 LAB — LIPID PANEL
Chol/HDL Ratio: 5 ratio — ABNORMAL HIGH (ref 0.0–4.4)
Cholesterol, Total: 225 mg/dL — ABNORMAL HIGH (ref 100–199)
HDL: 45 mg/dL (ref >39)
LDL Calculated: 154 mg/dL — ABNORMAL HIGH (ref 0–99)
Triglycerides: 130 mg/dL (ref 0–149)
VLDL CHOLESTEROL CAL: 26 mg/dL (ref 5–40)

## 2018-02-26 LAB — TSH: TSH: 2.89 u[IU]/mL (ref 0.450–4.500)

## 2018-03-04 ENCOUNTER — Encounter: Payer: Self-pay | Admitting: Family Medicine

## 2018-03-04 ENCOUNTER — Ambulatory Visit (INDEPENDENT_AMBULATORY_CARE_PROVIDER_SITE_OTHER): Payer: Managed Care, Other (non HMO) | Admitting: Family Medicine

## 2018-03-04 VITALS — BP 106/68 | HR 75 | Temp 98.6°F | Ht 64.5 in | Wt 144.2 lb

## 2018-03-04 DIAGNOSIS — E785 Hyperlipidemia, unspecified: Secondary | ICD-10-CM | POA: Diagnosis not present

## 2018-03-04 DIAGNOSIS — G629 Polyneuropathy, unspecified: Secondary | ICD-10-CM | POA: Diagnosis not present

## 2018-03-04 DIAGNOSIS — Z0000 Encounter for general adult medical examination without abnormal findings: Secondary | ICD-10-CM | POA: Diagnosis not present

## 2018-03-04 NOTE — Patient Instructions (Addendum)
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

## 2018-03-04 NOTE — Progress Notes (Signed)
Subjective:    Patient ID: Caitlin Ward, female    DOB: 01-27-1981, 37 y.o.   MRN: [id number]  HPI Here for health maintenance exam and to review chronic medical problems    Busy summer  Moved locally - that was stressful /now over and now very happy  Also went to the beach  Kids are doing great    Wt Readings from Last 3 Encounters:  03/04/18 144 lb 4 oz (65.4 kg)  01/03/18 140 lb 8 oz (63.7 kg)  11/15/16 141 lb 8 oz (64.2 kg)  loves to run Not as much time lately-but very active with moving  Bike riding  24.38 kg/m   Eats fairly healthy- could do better  Avoids red meat   HIV screen neg 5/16  Does not desire any STD screening  Flu shots - does not get   Pap 4/17-normal with neg HPV No hx of abn paps or gyn problems  IUD - no periods /really likes it  Thinks she has to have it changed next year   Has not started mammograms due to age Self breast exam -no lumps   Personal hx of melanoma  Has appt tomorrow  Goes to derm once per year No worrisome moles   Neuropathy is the same- (worse in the winter)   Mild hyperlipidemia Lab Results  Component Value Date   CHOL 225 (H) 02/25/2018   CHOL 206 (H) 11/13/2016   CHOL 194 10/27/2015   Lab Results  Component Value Date   HDL 45 02/25/2018   HDL 40 11/13/2016   HDL 36 (L) 10/27/2015   Lab Results  Component Value Date   LDLCALC 154 (H) 02/25/2018   LDLCALC 139 (H) 11/13/2016   LDLCALC 131 (H) 10/27/2015   Lab Results  Component Value Date   TRIG 130 02/25/2018   TRIG 137 11/13/2016   TRIG 137 10/27/2015   Lab Results  Component Value Date   CHOLHDL 5.0 (H) 02/25/2018   CHOLHDL 5.2 (H) 11/13/2016   CHOLHDL 5.4 (H) 10/27/2015   No results found for: LDLDIRECT Mother has high cholesterol   Less red meat  Some cheese and butter  Wants to re check in 3 mo     Other labs Results for orders placed or performed in visit on 02/25/18  CBC with Differential/Platelet  Result Value Ref Range    WBC 6.0 3.4 - 10.8 x10E3/uL   RBC 4.35 3.77 - 5.28 x10E6/uL   Hemoglobin 13.0 11.1 - 15.9 g/dL   Hematocrit 39.7 34.0 - 46.6 %   MCV 91 79 - 97 fL   MCH 29.9 26.6 - 33.0 pg   MCHC 32.7 31.5 - 35.7 g/dL   RDW 13.2 12.3 - 15.4 %   Platelets 377 150 - 450 x10E3/uL   Neutrophils 56 Not Estab. %   Lymphs 31 Not Estab. %   Monocytes 9 Not Estab. %   Eos 4 Not Estab. %   Basos 0 Not Estab. %   Neutrophils Absolute 3.4 1.4 - 7.0 x10E3/uL   Lymphocytes Absolute 1.9 0.7 - 3.1 x10E3/uL   Monocytes Absolute 0.5 0.1 - 0.9 x10E3/uL   EOS (ABSOLUTE) 0.2 0.0 - 0.4 x10E3/uL   Basophils Absolute 0.0 0.0 - 0.2 x10E3/uL   Immature Granulocytes 0 Not Estab. %   Immature Grans (Abs) 0.0 0.0 - 0.1 x10E3/uL  Comprehensive metabolic panel  Result Value Ref Range   Glucose 102 (H) 65 - 99 mg/dL   BUN 13 6 -  20 mg/dL   Creatinine, Ser 0.69 0.57 - 1.00 mg/dL   GFR calc non Af Amer 112 >59 mL/min/1.73   GFR calc Af Amer 130 >59 mL/min/1.73   BUN/Creatinine Ratio 19 9 - 23   Sodium 141 134 - 144 mmol/L   Potassium 4.8 3.5 - 5.2 mmol/L   Chloride 102 96 - 106 mmol/L   CO2 25 20 - 29 mmol/L   Calcium 9.4 8.7 - 10.2 mg/dL   Total Protein 6.5 6.0 - 8.5 g/dL   Albumin 4.3 3.5 - 5.5 g/dL   Globulin, Total 2.2 1.5 - 4.5 g/dL   Albumin/Globulin Ratio 2.0 1.2 - 2.2   Bilirubin Total 0.3 0.0 - 1.2 mg/dL   Alkaline Phosphatase 43 39 - 117 IU/L   AST 17 0 - 40 IU/L   ALT 11 0 - 32 IU/L  Lipid panel  Result Value Ref Range   Cholesterol, Total 225 (H) 100 - 199 mg/dL   Triglycerides 130 0 - 149 mg/dL   HDL 45 >39 mg/dL   VLDL Cholesterol Cal 26 5 - 40 mg/dL   LDL Calculated 154 (H) 0 - 99 mg/dL   Chol/HDL Ratio 5.0 (H) 0.0 - 4.4 ratio  TSH  Result Value Ref Range   TSH 2.890 0.450 - 4.500 uIU/mL    Patient Active Problem List   Diagnosis Date Noted  . Abnormal MRI, cervical spine 02/05/2015  . 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 Temperature intolerance 09/28/2014  . Hyperlipidemia, mild  09/19/2013  . Encounter for routine gynecological examination 07/30/2012  . Routine general medical examination at a health care facility 07/21/2012  . Myofascial pain 05/28/2012  . Hepatic lesion 03/07/2012  . Hematuria, microscopic 10/23/2011  . SLEEP DISORDER 04/27/2010  . DYSMENORRHEA 02/27/2007   Past Medical History:  Diagnosis Date  . Malignant melanoma ([place])    left arm- wide excision  . Skin cancer    Past Surgical History:  Procedure Laterality Date  . BREAST ENHANCEMENT SURGERY     Social History   Tobacco Use  . Smoking status: Never Smoker  . Smokeless tobacco: Never Used  Substance Use Topics  . Alcohol use: Yes    Alcohol/week: 0.0 oz    Comment: Occ  . Drug use: No   Family History  Problem Relation Age of Onset  . Coronary artery disease Maternal Grandfather   . Hypertension Paternal Grandfather   . Nephrolithiasis Mother    Allergies  Allergen Reactions  . Penicillins     REACTION: As a small child does not remember exact reaction.   Current Outpatient Medications on File Prior to Visit  Medication Sig Dispense Refill  . IUD'S IU by Intrauterine route.       No current facility-administered medications on file prior to visit.     Review of Systems  Constitutional: Negative for activity change, appetite change, fatigue, fever and unexpected weight change.  HENT: Negative for congestion, ear pain, rhinorrhea, sinus pressure and sore throat.   Eyes: Negative for pain, redness and visual disturbance.  Respiratory: Negative for cough, shortness of breath and wheezing.   Cardiovascular: Negative for chest pain and palpitations.  Gastrointestinal: Negative for abdominal pain, blood in stool, constipation and diarrhea.  Endocrine: Negative for polydipsia and polyuria.  Genitourinary: Negative for dysuria, frequency and urgency.  Musculoskeletal: Negative for arthralgias, back pain and myalgias.  Skin: Negative for pallor and rash.    Allergic/Immunologic: Negative for environmental allergies.  Neurological: Negative for dizziness, syncope and headaches.  Continues neuropathy symptoms of hot hands and feet   Hematological: Negative for adenopathy. Does not bruise/bleed easily.  Psychiatric/Behavioral: Negative for decreased concentration and dysphoric mood. The patient is not nervous/anxious.        Objective:   Physical Exam  Constitutional: She appears well-developed and well-nourished. No distress.  Well appearing   HENT:  Head: Normocephalic and atraumatic.  Right Ear: External ear normal.  Left Ear: External ear normal.  Mouth/Throat: Oropharynx is clear and moist.  Eyes: Pupils are equal, round, and reactive to light. Conjunctivae and EOM are normal. No scleral icterus.  Neck: Normal range of motion. Neck supple. No JVD present. Carotid bruit is not present. No thyromegaly present.  Cardiovascular: Normal rate, regular rhythm, normal heart sounds and intact distal pulses. Exam reveals no gallop.  Pulmonary/Chest: Effort normal and breath sounds normal. No respiratory distress. She has no wheezes. She exhibits no tenderness. No breast tenderness, discharge or bleeding.  Abdominal: Soft. Bowel sounds are normal. She exhibits no distension, no abdominal bruit and no mass. There is no tenderness.  Genitourinary: No breast tenderness, discharge or bleeding.  Genitourinary Comments: Breast implants noted Breast exam: No mass, nodules, thickening, tenderness, bulging, retraction, inflamation, nipple discharge or skin changes noted.  No axillary or clavicular LA.      Musculoskeletal: Normal range of motion. She exhibits no edema or tenderness.  Lymphadenopathy:    She has no cervical adenopathy.  Neurological: She is alert. She has normal reflexes. She displays normal reflexes. No cranial nerve deficit. She exhibits normal muscle tone. Coordination normal.  Skin: Skin is warm and dry. No rash noted. No erythema.  No pallor.  Solar lentigines diffusely   Psychiatric: She has a normal mood and affect.  Pleasant/ good mood          Assessment & Plan:   Problem List Items Addressed This Visit      Nervous and Auditory   Small fiber neuropathy    Stable -pt deals with symptoms         Other   Hyperlipidemia, mild    Disc goals for lipids and reasons to control them Rev last labs with pt Rev low sat fat diet in detail LDL trending up Suspect she will need statin in the future Rev diet-will cut out some fatty dairy products Re check lipids in 3 months and then disc tx if not improved       Routine general medical examination at a health care facility - Primary    Reviewed health habits including diet and exercise and skin cancer prevention Reviewed appropriate screening tests for age  Also reviewed health mt list, fam hx and immunization status , as well as social and family history   See HPI Labs reviewed  Disc tx strategy for lipids Derm appt yearly for h/o melanoma  Doing well with IUD  Pap due in a year

## 2018-03-04 NOTE — Assessment & Plan Note (Signed)
Stable -pt deals with symptoms

## 2018-03-04 NOTE — Assessment & Plan Note (Signed)
Reviewed health habits including diet and exercise and skin cancer prevention Reviewed appropriate screening tests for age  Also reviewed health mt list, fam hx and immunization status , as well as social and family history   See HPI Labs reviewed  Disc tx strategy for lipids Derm appt yearly for h/o melanoma  Doing well with IUD  Pap due in a year

## 2018-03-04 NOTE — Assessment & Plan Note (Signed)
Disc goals for lipids and reasons to control them Rev last labs with pt Rev low sat fat diet in detail LDL trending up Suspect she will need statin in the future Rev diet-will cut out some fatty dairy products Re check lipids in 3 months and then disc tx if not improved

## 2018-06-02 ENCOUNTER — Telehealth: Payer: Self-pay | Admitting: Family Medicine

## 2018-06-02 DIAGNOSIS — E785 Hyperlipidemia, unspecified: Secondary | ICD-10-CM

## 2018-06-02 NOTE — Telephone Encounter (Signed)
-----   Message from Ellamae Sia sent at 05/27/2018  2:43 PM EDT ----- Regarding: Lab orders for Monday, 11.4.19 Lab orders for a 3 month follow up appt.

## 2018-06-04 ENCOUNTER — Other Ambulatory Visit (INDEPENDENT_AMBULATORY_CARE_PROVIDER_SITE_OTHER): Payer: Managed Care, Other (non HMO)

## 2018-06-04 DIAGNOSIS — E785 Hyperlipidemia, unspecified: Secondary | ICD-10-CM

## 2018-06-04 LAB — LIPID PANEL
CHOLESTEROL: 204 mg/dL — AB (ref 0–200)
HDL: 44.7 mg/dL (ref >39.00)
LDL CALC: 132 mg/dL — AB (ref 0–99)
NonHDL: 159.77
Total CHOL/HDL Ratio: 5
Triglycerides: 141 mg/dL (ref 0.0–149.0)
VLDL: 28.2 mg/dL (ref 0.0–40.0)

## 2018-11-24 NOTE — Progress Notes (Signed)
Caitlin Mclaurin T. [name], MD Primary Care and [place] [facility] [place], [address] Phone: [phone]  FAX: [place] - 38 y.o. female  MRN [id number]  Date of Birth: Dec 12, 1980  Visit Date: 11/25/2018  PCP: Abner Greenspan, MD  Referred by: Tower, Wynelle Fanny, MD  Chief Complaint  Patient presents with  . Hip Pain    Bilateral   Subjective:   Caitlin Ward is a 38 y.o. very pleasant female patient who presents with the following:  Nice young lady Body mass index is 24.93 kg/m. patient of Dr. Marliss Coots who presents for evaluation of B hip pain that is more lateral and proximal to the GTB that worsens after running > 2 miles.  B hip pain, running a lot since august.  More miles and worst.  Has done some foam rolling.   15-18 miles in the past.  She has run several 1/2 marathons. Runner for a long time.  All lateral.  Not groin pain.  No back pain.  No numbness or radiculopathy.  No acute injury. Has also tried some yoga videos.  glute med and min L > R  Past Medical History, Surgical History, Social History, Family History, Problem List, Medications, and Allergies have been reviewed and updated if relevant.  Patient Active Problem List   Diagnosis Date Noted  . Abnormal MRI, cervical spine 02/05/2015  . 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 Temperature intolerance 09/28/2014  . Hyperlipidemia, mild 09/19/2013  . Encounter for routine gynecological examination 07/30/2012  . Routine general medical examination at a health care facility 07/21/2012  . Myofascial pain 05/28/2012  . Hepatic lesion 03/07/2012  . Hematuria, microscopic 10/23/2011  . SLEEP DISORDER 04/27/2010  . DYSMENORRHEA 02/27/2007    Past Medical History:  Diagnosis Date  . Malignant melanoma ([place])    left arm- wide excision  . Skin cancer     Past Surgical History:  Procedure Laterality Date  . BREAST  ENHANCEMENT SURGERY      Social History   Socioeconomic History  . Marital status: Married    Spouse name: Not on file  . Number of children: Not on file  . Years of education: Not on file  . Highest education level: Not on file  Occupational History  . Occupation: Company secretary: [employer]    Comment: loves it  Social Needs  . Financial resource strain: Not on file  . Food insecurity:    Worry: Not on file    Inability: Not on file  . Transportation needs:    Medical: Not on file    Non-medical: Not on file  Tobacco Use  . Smoking status: Never Smoker  . Smokeless tobacco: Never Used  Substance and Sexual Activity  . Alcohol use: Yes    Alcohol/week: 0.0 standard drinks    Comment: Occ  . Drug use: No  . Sexual activity: Yes    Partners: Male  Lifestyle  . Physical activity:    Days per week: Not on file    Minutes per session: Not on file  . Stress: Not on file  Relationships  . Social connections:    Talks on phone: Not on file    Gets together: Not on file    Attends religious service: Not on file    Active member of club or organization: Not on file    Attends meetings of clubs or organizations:  Not on file    Relationship status: Not on file  . Intimate partner violence:    Fear of current or ex partner: Not on file    Emotionally abused: Not on file    Physically abused: Not on file    Forced sexual activity: Not on file  Other Topics Concern  . Not on file  Social History Narrative   Exercises regularly    Family History  Problem Relation Age of Onset  . Coronary artery disease Maternal Grandfather   . Hypertension Paternal Grandfather   . Nephrolithiasis Mother     Allergies  Allergen Reactions  . Penicillins     REACTION: As a small child does not remember exact reaction.    Medication list reviewed and updated in full in [place].  GEN: No fevers, chills. Nontoxic. Primarily MSK c/o today. MSK: Detailed in the HPI  GI: tolerating PO intake without difficulty Neuro: No numbness, parasthesias, or tingling associated. Otherwise the pertinent positives of the ROS are noted above.   Objective:   BP 100/72   Pulse 64   Temp 98.9 F (37.2 C) (Oral)   Ht 5' 4.5" (1.638 m)   Wt 147 lb 8 oz (66.9 kg)   BMI 24.93 kg/m    GEN: WDWN, NAD, Non-toxic, Alert & Oriented x 3 HEENT: Atraumatic, Normocephalic.  Ears and Nose: No external deformity. EXTR: No clubbing/cyanosis/edema NEURO: Normal gait.  PSYCH: Normally interactive. Conversant. Not depressed or anxious appearing.  Calm demeanor.    HIP EXAM: SIDE: B ROM: Abduction, Flexion, Internal and External range of motion: full Pain with terminal IROM and EROM: none GTB: NT SLR: NEG Knees: No effusion FABER: NT REVERSE FABER: NT, neg Piriformis: NT at direct palpation Str: flexion: 4/5 on L abduction: 5/5, 4+ on L adduction: 5/5, 4+ on L Strength testing non-tender  Pes cavus B  No significant pronation or supination on running gait  Hop test is negative  Radiology: No results found.  Assessment and Plan:   Tendinopathy of left gluteus medius  Tendinopathy of right gluteus medius  Anatomically, her pain corresponds to gluteus medius and minimus tendinopathy with likely some pelvic rim bursitis as well.  This corresponds to her onset of pain 2 miles into her run when fatigue plays a factor at the pelvic stabilizers.  I gave her a very extensive pelvic musculature rehab and core rehab protocol from [facility].  Encouraged her to keep doing yoga.  Intermittently crosstraining, and I will pulse her with some steroids now.  I appreciate the opportunity to evaluate this very friendly patient. If you have any question regarding her care or prognosis, do not hesitate to ask.   Follow-up: Return in about 6 weeks (around 01/06/2019).  Meds ordered this encounter  Medications  . predniSONE (DELTASONE) 20 MG tablet    Sig: 2 tabs po daily for  5 days, then 1 tab po daily for 5 days    Dispense:  15 tablet    Refill:  0   Signed,  Kairie Vangieson T. Knolan Simien, MD   Outpatient Encounter Medications as of 11/25/2018  Medication Sig  . IUD'S IU by Intrauterine route.    . predniSONE (DELTASONE) 20 MG tablet 2 tabs po daily for 5 days, then 1 tab po daily for 5 days   No facility-administered encounter medications on file as of 11/25/2018.

## 2018-11-25 ENCOUNTER — Ambulatory Visit: Payer: Managed Care, Other (non HMO) | Admitting: Family Medicine

## 2018-11-25 ENCOUNTER — Other Ambulatory Visit: Payer: Self-pay

## 2018-11-25 ENCOUNTER — Encounter: Payer: Self-pay | Admitting: Family Medicine

## 2018-11-25 VITALS — BP 100/72 | HR 64 | Temp 98.9°F | Ht 64.5 in | Wt 147.5 lb

## 2018-11-25 DIAGNOSIS — M67952 Unspecified disorder of synovium and tendon, left thigh: Secondary | ICD-10-CM

## 2018-11-25 DIAGNOSIS — M6798 Unspecified disorder of synovium and tendon, other site: Secondary | ICD-10-CM | POA: Diagnosis not present

## 2018-11-25 DIAGNOSIS — M67951 Unspecified disorder of synovium and tendon, right thigh: Secondary | ICD-10-CM

## 2018-11-25 MED ORDER — PREDNISONE 20 MG PO TABS: ORAL_TABLET | ORAL | 0 refills | Status: DC

## 2019-01-06 ENCOUNTER — Ambulatory Visit: Payer: Managed Care, Other (non HMO) | Admitting: Family Medicine

## 2019-04-30 ENCOUNTER — Encounter: Payer: Self-pay | Admitting: Family Medicine

## 2019-05-19 ENCOUNTER — Encounter: Payer: Self-pay | Admitting: Family Medicine

## 2019-06-04 ENCOUNTER — Telehealth: Payer: Self-pay | Admitting: Family Medicine

## 2019-06-04 DIAGNOSIS — E785 Hyperlipidemia, unspecified: Secondary | ICD-10-CM

## 2019-06-04 DIAGNOSIS — Z0000 Encounter for general adult medical examination without abnormal findings: Secondary | ICD-10-CM

## 2019-06-04 NOTE — Telephone Encounter (Signed)
-----   Message from Ellamae Sia sent at 05/26/2019  2:26 PM EDT ----- Regarding: Lab orders for Thursday, 11.5.20 Patient is scheduled for CPX labs, please order future labs, Thanks , Karna Christmas

## 2019-06-05 ENCOUNTER — Other Ambulatory Visit (INDEPENDENT_AMBULATORY_CARE_PROVIDER_SITE_OTHER): Payer: Managed Care, Other (non HMO)

## 2019-06-05 DIAGNOSIS — E785 Hyperlipidemia, unspecified: Secondary | ICD-10-CM

## 2019-06-05 DIAGNOSIS — Z0000 Encounter for general adult medical examination without abnormal findings: Secondary | ICD-10-CM | POA: Diagnosis not present

## 2019-06-05 NOTE — Addendum Note (Signed)
Addended by: Ellamae Sia on: 06/05/2019 08:34 AM   Modules accepted: Orders

## 2019-06-06 LAB — LIPID PANEL
Chol/HDL Ratio: 4.5 ratio — ABNORMAL HIGH (ref 0.0–4.4)
Cholesterol, Total: 199 mg/dL (ref 100–199)
HDL: 44 mg/dL (ref >39)
LDL Chol Calc (NIH): 135 mg/dL — ABNORMAL HIGH (ref 0–99)
Triglycerides: 109 mg/dL (ref 0–149)
VLDL Cholesterol Cal: 20 mg/dL (ref 5–40)

## 2019-06-06 LAB — COMPREHENSIVE METABOLIC PANEL
ALT: 10 IU/L (ref 0–32)
AST: 19 IU/L (ref 0–40)
Albumin/Globulin Ratio: 1.8 (ref 1.2–2.2)
Albumin: 4.2 g/dL (ref 3.8–4.8)
Alkaline Phosphatase: 48 IU/L (ref 39–117)
BUN/Creatinine Ratio: 13 (ref 9–23)
BUN: 10 mg/dL (ref 6–20)
Bilirubin Total: 0.8 mg/dL (ref 0.0–1.2)
CO2: 23 mmol/L (ref 20–29)
Calcium: 9.6 mg/dL (ref 8.7–10.2)
Chloride: 101 mmol/L (ref 96–106)
Creatinine, Ser: 0.77 mg/dL (ref 0.57–1.00)
GFR calc Af Amer: 114 mL/min/{1.73_m2} (ref >59)
GFR calc non Af Amer: 99 mL/min/{1.73_m2} (ref >59)
Globulin, Total: 2.3 g/dL (ref 1.5–4.5)
Glucose: 100 mg/dL — ABNORMAL HIGH (ref 65–99)
Potassium: 4.8 mmol/L (ref 3.5–5.2)
Sodium: 137 mmol/L (ref 134–144)
Total Protein: 6.5 g/dL (ref 6.0–8.5)

## 2019-06-06 LAB — CBC WITH DIFFERENTIAL/PLATELET
Basophils Absolute: 0 10*3/uL (ref 0.0–0.2)
Basos: 1 %
EOS (ABSOLUTE): 0.2 10*3/uL (ref 0.0–0.4)
Eos: 3 %
Hematocrit: 37.5 % (ref 34.0–46.6)
Hemoglobin: 13 g/dL (ref 11.1–15.9)
Immature Grans (Abs): 0 10*3/uL (ref 0.0–0.1)
Immature Granulocytes: 0 %
Lymphocytes Absolute: 1.5 10*3/uL (ref 0.7–3.1)
Lymphs: 26 %
MCH: 30.6 pg (ref 26.6–33.0)
MCHC: 34.7 g/dL (ref 31.5–35.7)
MCV: 88 fL (ref 79–97)
Monocytes Absolute: 0.4 10*3/uL (ref 0.1–0.9)
Monocytes: 7 %
Neutrophils Absolute: 3.6 10*3/uL (ref 1.4–7.0)
Neutrophils: 63 %
Platelets: 332 10*3/uL (ref 150–450)
RBC: 4.25 x10E6/uL (ref 3.77–5.28)
RDW: 12.8 % (ref 11.7–15.4)
WBC: 5.7 10*3/uL (ref 3.4–10.8)

## 2019-06-06 LAB — TSH: TSH: 1.89 u[IU]/mL (ref 0.450–4.500)

## 2019-06-11 ENCOUNTER — Other Ambulatory Visit: Payer: Self-pay

## 2019-06-11 ENCOUNTER — Encounter: Payer: Self-pay | Admitting: Family Medicine

## 2019-06-11 ENCOUNTER — Ambulatory Visit (INDEPENDENT_AMBULATORY_CARE_PROVIDER_SITE_OTHER): Payer: Managed Care, Other (non HMO) | Admitting: Family Medicine

## 2019-06-11 VITALS — BP 112/76 | HR 67 | Temp 97.9°F | Ht 64.75 in | Wt 143.3 lb

## 2019-06-11 DIAGNOSIS — Z23 Encounter for immunization: Secondary | ICD-10-CM

## 2019-06-11 DIAGNOSIS — G629 Polyneuropathy, unspecified: Secondary | ICD-10-CM

## 2019-06-11 DIAGNOSIS — E785 Hyperlipidemia, unspecified: Secondary | ICD-10-CM

## 2019-06-11 DIAGNOSIS — Z0000 Encounter for general adult medical examination without abnormal findings: Secondary | ICD-10-CM | POA: Diagnosis not present

## 2019-06-11 NOTE — Assessment & Plan Note (Signed)
Reviewed health habits including diet and exercise and skin cancer prevention Reviewed appropriate screening tests for age  Also reviewed health mt list, fam hx and immunization status , as well as social and family history   See HPI Labs reviewed Flu shot given  Sent for last pap report from gyn  Enc self breast exam  Continues regular dermatology visits and uses sunscreen

## 2019-06-11 NOTE — Progress Notes (Signed)
Subjective:    Patient ID: Caitlin Ward, female    DOB: May 20, 1981, 38 y.o.   MRN: [id number]  HPI  Here for health maintenance exam and to review chronic medical problems    Working at home during the pandemic  This is hard on her  Kids are at home doing school   Had covid in august  She got pretty sick for about 10 days She had antibody test afterwards  It went through the family also   Feeling good overall   Still frustrated with her neuropathy  Feet/hands  Now her ears flush   ? Erythromelalgia  Tried aspirin daily for a while-did not help    Wt Readings from Last 3 Encounters:  06/11/19 143 lb 5 oz (65 kg)  11/25/18 147 lb 8 oz (66.9 kg)  03/04/18 144 lb 4 oz (65.4 kg)  she has increased her exercise  Is hard on her joints - knees and hip   24.03 kg/m   Pap 4/17-neg with neg HPV screen Had her pap this year in June  Menses- none with the IUD Not planning on pregnancy  Has iud - just had it replaced and it works very well   Self breast exam  No lumps  Had breast exam at gyn   Flu vaccine-given today   Depression screen score is 0   Td 6/11  Personal hx of melanoma  Seen in august  Nothing new and no proceedures  Good report  Uses sunscreen   Hyperlipidemia  Lab Results  Component Value Date   CHOL 199 06/05/2019   CHOL 204 (H) 06/04/2018   CHOL 225 (H) 02/25/2018   Lab Results  Component Value Date   HDL 44 06/05/2019   HDL 44.70 06/04/2018   HDL 45 02/25/2018   Lab Results  Component Value Date   LDLCALC 135 (H) 06/05/2019   LDLCALC 132 (H) 06/04/2018   LDLCALC 154 (H) 02/25/2018   Lab Results  Component Value Date   TRIG 109 06/05/2019   TRIG 141.0 06/04/2018   TRIG 130 02/25/2018   Lab Results  Component Value Date   CHOLHDL 4.5 (H) 06/05/2019   CHOLHDL 5 06/04/2018   CHOLHDL 5.0 (H) 02/25/2018   No results found for: LDLDIRECT Diet controlled Is very mindful of red meat  Some fried foods - chik filet  Good  exercise    Other labs Results for orders placed or performed in visit on 06/05/19  CBC with Differential  Result Value Ref Range   WBC 5.7 3.4 - 10.8 x10E3/uL   RBC 4.25 3.77 - 5.28 x10E6/uL   Hemoglobin 13.0 11.1 - 15.9 g/dL   Hematocrit 37.5 34.0 - 46.6 %   MCV 88 79 - 97 fL   MCH 30.6 26.6 - 33.0 pg   MCHC 34.7 31.5 - 35.7 g/dL   RDW 12.8 11.7 - 15.4 %   Platelets 332 150 - 450 x10E3/uL   Neutrophils 63 Not Estab. %   Lymphs 26 Not Estab. %   Monocytes 7 Not Estab. %   Eos 3 Not Estab. %   Basos 1 Not Estab. %   Neutrophils Absolute 3.6 1.4 - 7.0 x10E3/uL   Lymphocytes Absolute 1.5 0.7 - 3.1 x10E3/uL   Monocytes Absolute 0.4 0.1 - 0.9 x10E3/uL   EOS (ABSOLUTE) 0.2 0.0 - 0.4 x10E3/uL   Basophils Absolute 0.0 0.0 - 0.2 x10E3/uL   Immature Granulocytes 0 Not Estab. %   Immature Grans (Abs) 0.0 0.0 -  0.1 x10E3/uL  Comprehensive metabolic panel  Result Value Ref Range   Glucose 100 (H) 65 - 99 mg/dL   BUN 10 6 - 20 mg/dL   Creatinine, Ser 0.77 0.57 - 1.00 mg/dL   GFR calc non Af Amer 99 >59 mL/min/1.73   GFR calc Af Amer 114 >59 mL/min/1.73   BUN/Creatinine Ratio 13 9 - 23   Sodium 137 134 - 144 mmol/L   Potassium 4.8 3.5 - 5.2 mmol/L   Chloride 101 96 - 106 mmol/L   CO2 23 20 - 29 mmol/L   Calcium 9.6 8.7 - 10.2 mg/dL   Total Protein 6.5 6.0 - 8.5 g/dL   Albumin 4.2 3.8 - 4.8 g/dL   Globulin, Total 2.3 1.5 - 4.5 g/dL   Albumin/Globulin Ratio 1.8 1.2 - 2.2   Bilirubin Total 0.8 0.0 - 1.2 mg/dL   Alkaline Phosphatase 48 39 - 117 IU/L   AST 19 0 - 40 IU/L   ALT 10 0 - 32 IU/L  Lipid panel  Result Value Ref Range   Cholesterol, Total 199 100 - 199 mg/dL   Triglycerides 109 0 - 149 mg/dL   HDL 44 >39 mg/dL   VLDL Cholesterol Cal 20 5 - 40 mg/dL   LDL Chol Calc (NIH) 135 (H) 0 - 99 mg/dL   Chol/HDL Ratio 4.5 (H) 0.0 - 4.4 ratio  TSH  Result Value Ref Range   TSH 1.890 0.450 - 4.500 uIU/mL     Patient Active Problem List   Diagnosis Date Noted  . Abnormal MRI,  cervical spine 02/05/2015  . 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 Temperature intolerance 09/28/2014  . Hyperlipidemia, mild 09/19/2013  . Encounter for routine gynecological examination 07/30/2012  . Routine general medical examination at a health care facility 07/21/2012  . Myofascial pain 05/28/2012  . Hepatic lesion 03/07/2012  . Hematuria, microscopic 10/23/2011  . SLEEP DISORDER 04/27/2010  . DYSMENORRHEA 02/27/2007   Past Medical History:  Diagnosis Date  . Malignant melanoma ([place])    left arm- wide excision  . Skin cancer    Past Surgical History:  Procedure Laterality Date  . BREAST ENHANCEMENT SURGERY     Social History   Tobacco Use  . Smoking status: Never Smoker  . Smokeless tobacco: Never Used  Substance Use Topics  . Alcohol use: Yes    Alcohol/week: 0.0 standard drinks    Comment: Occ  . Drug use: No   Family History  Problem Relation Age of Onset  . Coronary artery disease Maternal Grandfather   . Hypertension Paternal Grandfather   . Nephrolithiasis Mother    Allergies  Allergen Reactions  . Penicillins     REACTION: As a small child does not remember exact reaction.   Current Outpatient Medications on File Prior to Visit  Medication Sig Dispense Refill  . IUD'S IU by Intrauterine route.       No current facility-administered medications on file prior to visit.       Review of Systems  Constitutional: Negative for activity change, appetite change, fatigue, fever and unexpected weight change.  HENT: Negative for congestion, ear pain, rhinorrhea, sinus pressure and sore throat.   Eyes: Negative for pain, redness and visual disturbance.  Respiratory: Negative for cough, shortness of breath and wheezing.   Cardiovascular: Negative for chest pain and palpitations.  Gastrointestinal: Negative for abdominal pain, blood in stool, constipation and diarrhea.  Endocrine: Negative for polydipsia and polyuria.  Genitourinary: Negative for dysuria,  frequency and urgency.  Musculoskeletal: Positive for arthralgias. Negative for back pain and myalgias.       Sore joints after working out  Skin: Negative for pallor and rash.  Allergic/Immunologic: Negative for environmental allergies.  Neurological: Negative for dizziness, syncope and headaches.       Hot tingling sensation in hands /feet and ears intermittently  Hematological: Negative for adenopathy. Does not bruise/bleed easily.  Psychiatric/Behavioral: Negative for decreased concentration and dysphoric mood. The patient is not nervous/anxious.        Objective:   Physical Exam Constitutional:      General: She is not in acute distress.    Appearance: Normal appearance. She is well-developed and normal weight. She is not ill-appearing or diaphoretic.  HENT:     Head: Normocephalic and atraumatic.     Right Ear: Tympanic membrane, ear canal and external ear normal.     Left Ear: Tympanic membrane, ear canal and external ear normal.     Nose: Nose normal. No congestion.     Mouth/Throat:     Mouth: Mucous membranes are moist.     Pharynx: Oropharynx is clear. No posterior oropharyngeal erythema.  Eyes:     General: No scleral icterus.    Extraocular Movements: Extraocular movements intact.     Conjunctiva/sclera: Conjunctivae normal.     Pupils: Pupils are equal, round, and reactive to light.  Neck:     Musculoskeletal: Normal range of motion and neck supple. No neck rigidity or muscular tenderness.     Thyroid: No thyromegaly.     Vascular: No carotid bruit or JVD.  Cardiovascular:     Rate and Rhythm: Normal rate and regular rhythm.     Pulses: Normal pulses.     Heart sounds: Normal heart sounds. No gallop.   Pulmonary:     Effort: Pulmonary effort is normal. No respiratory distress.     Breath sounds: Normal breath sounds. No wheezing.     Comments: Good air exch Chest:     Chest wall: No tenderness.  Abdominal:     General: Bowel sounds are normal. There is no  distension or abdominal bruit.     Palpations: Abdomen is soft. There is no mass.     Tenderness: There is no abdominal tenderness.     Hernia: No hernia is present.  Genitourinary:    Comments: Breast exam: No mass, nodules, thickening, tenderness, bulging, retraction, inflamation, nipple discharge or skin changes noted.  No axillary or clavicular LA.     Musculoskeletal: Normal range of motion.        General: No tenderness.     Right lower leg: No edema.     Left lower leg: No edema.  Lymphadenopathy:     Cervical: No cervical adenopathy.  Skin:    General: Skin is warm and dry.     Coloration: Skin is not pale.     Findings: No erythema or rash.     Comments: Some lentigines    Neurological:     Mental Status: She is alert. Mental status is at baseline.     Cranial Nerves: No cranial nerve deficit.     Motor: No abnormal muscle tone.     Coordination: Coordination normal.     Gait: Gait normal.     Deep Tendon Reflexes: Reflexes are normal and symmetric. Reflexes normal.  Psychiatric:        Mood and Affect: Mood normal.        Cognition and Memory: Cognition normal.  Comments: Pleasant            Assessment & Plan:   Problem List Items Addressed This Visit      Nervous and Auditory   Small fiber neuropathy    Overall stable-but has some flushing of outer ears         Other   Routine general medical examination at a health care facility - Primary    Reviewed health habits including diet and exercise and skin cancer prevention Reviewed appropriate screening tests for age  Also reviewed health mt list, fam hx and immunization status , as well as social and family history   See HPI Labs reviewed Flu shot given  Sent for last pap report from gyn  Enc self breast exam  Continues regular dermatology visits and uses sunscreen       Hyperlipidemia, mild    Stable with LDL in 130s Disc goals for lipids and reasons to control them Rev last labs with pt Rev  low sat fat diet in detail Pt wants to avoid statin at this time Excellent diet and exercise Will continue to follow       Other Visit Diagnoses    Need for influenza vaccination       Relevant Orders   Flu Vaccine QUAD 6+ mos PF IM (Fluarix Quad PF) (Completed)

## 2019-06-11 NOTE — Assessment & Plan Note (Signed)
Stable with LDL in 130s Disc goals for lipids and reasons to control them Rev last labs with pt Rev low sat fat diet in detail Pt wants to avoid statin at this time Excellent diet and exercise Will continue to follow

## 2019-06-11 NOTE — Assessment & Plan Note (Signed)
Overall stable-but has some flushing of outer ears

## 2019-06-11 NOTE — Patient Instructions (Signed)
Flu shot today  Glad you are doing well  Keep exercising and eating healthy  Labs are stable   For cholesterol  Avoid red meat/ fried foods/ egg yolks/ fatty breakfast meats/ butter, cheese and high fat dairy/ and shellfish

## 2020-06-10 ENCOUNTER — Telehealth: Payer: Self-pay | Admitting: Family Medicine

## 2020-06-10 DIAGNOSIS — Z0000 Encounter for general adult medical examination without abnormal findings: Secondary | ICD-10-CM

## 2020-06-10 DIAGNOSIS — E785 Hyperlipidemia, unspecified: Secondary | ICD-10-CM

## 2020-06-10 NOTE — Telephone Encounter (Signed)
-----   Message from Ellamae Sia sent at 05/26/2020  2:26 PM EDT ----- Regarding: Lab orders for Friday, 11.12.21 Patient is scheduled for CPX labs, please order future labs, Thanks , Karna Christmas

## 2020-06-11 ENCOUNTER — Other Ambulatory Visit (INDEPENDENT_AMBULATORY_CARE_PROVIDER_SITE_OTHER): Payer: Managed Care, Other (non HMO)

## 2020-06-11 ENCOUNTER — Other Ambulatory Visit: Payer: Self-pay

## 2020-06-11 DIAGNOSIS — Z0000 Encounter for general adult medical examination without abnormal findings: Secondary | ICD-10-CM | POA: Diagnosis not present

## 2020-06-11 DIAGNOSIS — E785 Hyperlipidemia, unspecified: Secondary | ICD-10-CM

## 2020-06-11 NOTE — Addendum Note (Signed)
Addended by: Ellamae Sia on: 06/11/2020 07:58 AM   Modules accepted: Orders

## 2020-06-12 LAB — CBC WITH DIFFERENTIAL/PLATELET
Basophils Absolute: 0 10*3/uL (ref 0.0–0.2)
Basos: 1 %
EOS (ABSOLUTE): 0.3 10*3/uL (ref 0.0–0.4)
Eos: 4 %
Hematocrit: 39.8 % (ref 34.0–46.6)
Hemoglobin: 13.9 g/dL (ref 11.1–15.9)
Immature Grans (Abs): 0 10*3/uL (ref 0.0–0.1)
Immature Granulocytes: 0 %
Lymphocytes Absolute: 2.1 10*3/uL (ref 0.7–3.1)
Lymphs: 33 %
MCH: 31.4 pg (ref 26.6–33.0)
MCHC: 34.9 g/dL (ref 31.5–35.7)
MCV: 90 fL (ref 79–97)
Monocytes Absolute: 0.5 10*3/uL (ref 0.1–0.9)
Monocytes: 8 %
Neutrophils Absolute: 3.5 10*3/uL (ref 1.4–7.0)
Neutrophils: 54 %
Platelets: 326 10*3/uL (ref 150–450)
RBC: 4.43 x10E6/uL (ref 3.77–5.28)
RDW: 11.7 % (ref 11.7–15.4)
WBC: 6.5 10*3/uL (ref 3.4–10.8)

## 2020-06-12 LAB — COMPREHENSIVE METABOLIC PANEL
ALT: 14 IU/L (ref 0–32)
AST: 17 IU/L (ref 0–40)
Albumin/Globulin Ratio: 1.9 (ref 1.2–2.2)
Albumin: 4.3 g/dL (ref 3.8–4.8)
Alkaline Phosphatase: 46 IU/L (ref 44–121)
BUN/Creatinine Ratio: 13 (ref 9–23)
BUN: 10 mg/dL (ref 6–20)
Bilirubin Total: 0.5 mg/dL (ref 0.0–1.2)
CO2: 25 mmol/L (ref 20–29)
Calcium: 9.7 mg/dL (ref 8.7–10.2)
Chloride: 101 mmol/L (ref 96–106)
Creatinine, Ser: 0.76 mg/dL (ref 0.57–1.00)
GFR calc Af Amer: 115 mL/min/{1.73_m2} (ref >59)
GFR calc non Af Amer: 100 mL/min/{1.73_m2} (ref >59)
Globulin, Total: 2.3 g/dL (ref 1.5–4.5)
Glucose: 104 mg/dL — ABNORMAL HIGH (ref 65–99)
Potassium: 4.6 mmol/L (ref 3.5–5.2)
Sodium: 139 mmol/L (ref 134–144)
Total Protein: 6.6 g/dL (ref 6.0–8.5)

## 2020-06-12 LAB — LIPID PANEL
Chol/HDL Ratio: 5.1 ratio — ABNORMAL HIGH (ref 0.0–4.4)
Cholesterol, Total: 219 mg/dL — ABNORMAL HIGH (ref 100–199)
HDL: 43 mg/dL (ref >39)
LDL Chol Calc (NIH): 162 mg/dL — ABNORMAL HIGH (ref 0–99)
Triglycerides: 81 mg/dL (ref 0–149)
VLDL Cholesterol Cal: 14 mg/dL (ref 5–40)

## 2020-06-12 LAB — TSH: TSH: 2.29 u[IU]/mL (ref 0.450–4.500)

## 2020-06-15 ENCOUNTER — Encounter: Payer: Self-pay | Admitting: Family Medicine

## 2020-06-15 ENCOUNTER — Other Ambulatory Visit: Payer: Self-pay

## 2020-06-15 ENCOUNTER — Ambulatory Visit (INDEPENDENT_AMBULATORY_CARE_PROVIDER_SITE_OTHER): Payer: Managed Care, Other (non HMO) | Admitting: Family Medicine

## 2020-06-15 VITALS — BP 112/64 | HR 65 | Temp 96.9°F | Ht 64.0 in | Wt 144.4 lb

## 2020-06-15 DIAGNOSIS — Z23 Encounter for immunization: Secondary | ICD-10-CM

## 2020-06-15 DIAGNOSIS — Z0001 Encounter for general adult medical examination with abnormal findings: Secondary | ICD-10-CM

## 2020-06-15 DIAGNOSIS — E785 Hyperlipidemia, unspecified: Secondary | ICD-10-CM | POA: Diagnosis not present

## 2020-06-15 MED ORDER — ROSUVASTATIN CALCIUM 5 MG PO TABS: 5.0000 mg | ORAL_TABLET | Freq: Every day | ORAL | 3 refills | Status: DC

## 2020-06-15 NOTE — Assessment & Plan Note (Signed)
Disc goals for lipids and reasons to control them Rev last labs with pt Rev low sat fat diet in detail LDL now over 160 Open to try crestor 5 mg daily (rev poss side eff) Will plan fasting check in 6 wk  Call if side eff Plans to cut back on high fat dairy

## 2020-06-15 NOTE — Patient Instructions (Addendum)
Try generic crestor 5 mg daily  Take in the evening with a low fat snack  Schedule fasting labs in 6 weeks   If side effects stop it and let me know   Avoid red meat/ fried foods/ egg yolks/ fatty breakfast meats/ butter, cheese and high fat dairy/ and shellfish   Keep up the great exercise   COVID-19 Vaccine Information can be found at: [employer]co[employer]uk For questions related to vaccine distribution or appointments, please email vaccine@ [email or web address] or call [phone].

## 2020-06-15 NOTE — Progress Notes (Signed)
Subjective:    Patient ID: Ginger Organ, female    DOB: July 09, 1981, 39 y.o.   MRN: [id number]  This visit occurred during the SARS-CoV-2 public health emergency.  Safety protocols were in place, including screening questions prior to the visit, additional usage of staff PPE, and extensive cleaning of exam room while observing appropriate contact time as indicated for disinfecting solutions.    HPI Here for health maintenance exam and to review chronic medical problems    Wt Readings from Last 3 Encounters:  06/15/20 144 lb 6 oz (65.5 kg)  06/11/19 143 lb 5 oz (65 kg)  11/25/18 147 lb 8 oz (66.9 kg)   24.78 kg/m   Working a lot  Will taking some time at the holidays    Pap 4/17-normal/ then went to gyn Had pap 6/20 Dr Corinna Capra and IUD was replaced Sees gyn  Has IUD - loves it  No periods at all   Td 6/11 Flu shot - wants to do today   covid status  Had covid aug 2020 (did get very sick but no hospital)  She is nervous to get the vaccine  Has antibody test at [facility] - and she still has antibodies  She does listen to information about it     HIV screen neg 2016  Past h/o melanoma  Has f/u in December   BP Readings from Last 3 Encounters:  06/15/20 112/64  06/11/19 112/76  11/25/18 100/72   Pulse Readings from Last 3 Encounters:  06/15/20 65  06/11/19 67  11/25/18 64   Exercising- goes to [employer]    Hyperlipidemia Lab Results  Component Value Date   CHOL 219 (H) 06/11/2020   CHOL 199 06/05/2019   CHOL 204 (H) 06/04/2018   Lab Results  Component Value Date   HDL 43 06/11/2020   HDL 44 06/05/2019   HDL 44.70 06/04/2018   Lab Results  Component Value Date   LDLCALC 162 (H) 06/11/2020   LDLCALC 135 (H) 06/05/2019   LDLCALC 132 (H) 06/04/2018   Lab Results  Component Value Date   TRIG 81 06/11/2020   TRIG 109 06/05/2019   TRIG 141.0 06/04/2018   Lab Results  Component Value Date   CHOLHDL 5.1 (H) 06/11/2020   CHOLHDL 4.5 (H)  06/05/2019   CHOLHDL 5 06/04/2018   No results found for: LDLDIRECT Declined statin in the past  Eating fair  More fried foods than she should  Avoids red meat - once per month  Too much cheese/butter   Declines medication   GM was diagnosed with dementia   Other labs  Results for orders placed or performed in visit on 06/11/20  CBC with Differential/Platelet  Result Value Ref Range   WBC 6.5 3.4 - 10.8 x10E3/uL   RBC 4.43 3.77 - 5.28 x10E6/uL   Hemoglobin 13.9 11.1 - 15.9 g/dL   Hematocrit 39.8 34.0 - 46.6 %   MCV 90 79 - 97 fL   MCH 31.4 26.6 - 33.0 pg   MCHC 34.9 31 - 35 g/dL   RDW 11.7 11.7 - 15.4 %   Platelets 326 150 - 450 x10E3/uL   Neutrophils 54 Not Estab. %   Lymphs 33 Not Estab. %   Monocytes 8 Not Estab. %   Eos 4 Not Estab. %   Basos 1 Not Estab. %   Neutrophils Absolute 3.5 1.40 - 7.00 x10E3/uL   Lymphocytes Absolute 2.1 0 - 3 x10E3/uL   Monocytes Absolute 0.5  0 - 0 x10E3/uL   EOS (ABSOLUTE) 0.3 0.0 - 0.4 x10E3/uL   Basophils Absolute 0.0 0 - 0 x10E3/uL   Immature Granulocytes 0 Not Estab. %   Immature Grans (Abs) 0.0 0.0 - 0.1 x10E3/uL  Comprehensive metabolic panel  Result Value Ref Range   Glucose 104 (H) 65 - 99 mg/dL   BUN 10 6 - 20 mg/dL   Creatinine, Ser 0.76 0.57 - 1.00 mg/dL   GFR calc non Af Amer 100 >59 mL/min/1.73   GFR calc Af Amer 115 >59 mL/min/1.73   BUN/Creatinine Ratio 13 9 - 23   Sodium 139 134 - 144 mmol/L   Potassium 4.6 3.5 - 5.2 mmol/L   Chloride 101 96 - 106 mmol/L   CO2 25 20 - 29 mmol/L   Calcium 9.7 8.7 - 10.2 mg/dL   Total Protein 6.6 6.0 - 8.5 g/dL   Albumin 4.3 3.8 - 4.8 g/dL   Globulin, Total 2.3 1.5 - 4.5 g/dL   Albumin/Globulin Ratio 1.9 1.2 - 2.2   Bilirubin Total 0.5 0.0 - 1.2 mg/dL   Alkaline Phosphatase 46 44 - 121 IU/L   AST 17 0 - 40 IU/L   ALT 14 0 - 32 IU/L  Lipid panel  Result Value Ref Range   Cholesterol, Total 219 (H) 100 - 199 mg/dL   Triglycerides 81 0 - 149 mg/dL   HDL 43 >39 mg/dL   VLDL  Cholesterol Cal 14 5 - 40 mg/dL   LDL Chol Calc (NIH) 162 (H) 0 - 99 mg/dL   Chol/HDL Ratio 5.1 (H) 0.0 - 4.4 ratio  TSH  Result Value Ref Range   TSH 2.290 0.450 - 4.500 uIU/mL     Patient Active Problem List   Diagnosis Date Noted  . Abnormal MRI, cervical spine 02/05/2015  . 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 Temperature intolerance 09/28/2014  . Hyperlipidemia, mild 09/19/2013  . Encounter for routine gynecological examination 07/30/2012  . Routine general medical examination at a health care facility 07/21/2012  . Myofascial pain 05/28/2012  . Hematuria, microscopic 10/23/2011  . SLEEP DISORDER 04/27/2010  . DYSMENORRHEA 02/27/2007   Past Medical History:  Diagnosis Date  . Malignant melanoma ([place])    left arm- wide excision  . Skin cancer    Past Surgical History:  Procedure Laterality Date  . BREAST ENHANCEMENT SURGERY     Social History   Tobacco Use  . Smoking status: Never Smoker  . Smokeless tobacco: Never Used  Substance Use Topics  . Alcohol use: Yes    Alcohol/week: 0.0 standard drinks    Comment: Occ  . Drug use: No   Family History  Problem Relation Age of Onset  . Coronary artery disease Maternal Grandfather   . Hypertension Paternal Grandfather   . Nephrolithiasis Mother    Allergies  Allergen Reactions  . Penicillins     REACTION: As a small child does not remember exact reaction.   Current Outpatient Medications on File Prior to Visit  Medication Sig Dispense Refill  . IUD'S IU by Intrauterine route.       No current facility-administered medications on file prior to visit.     Review of Systems  Constitutional: Negative for activity change, appetite change, fatigue, fever and unexpected weight change.  HENT: Negative for congestion, ear pain, rhinorrhea, sinus pressure and sore throat.   Eyes: Negative for pain, redness and visual disturbance.  Respiratory: Negative for cough, shortness of breath and wheezing.   Cardiovascular:  Negative  for chest pain and palpitations.  Gastrointestinal: Negative for abdominal pain, blood in stool, constipation and diarrhea.  Endocrine: Negative for polydipsia and polyuria.  Genitourinary: Negative for dysuria, frequency and urgency.  Musculoskeletal: Negative for arthralgias, back pain and myalgias.  Skin: Negative for pallor and rash.  Allergic/Immunologic: Negative for environmental allergies.  Neurological: Negative for dizziness, syncope and headaches.  Hematological: Negative for adenopathy. Does not bruise/bleed easily.  Psychiatric/Behavioral: Negative for decreased concentration and dysphoric mood. The patient is not nervous/anxious.        Objective:   Physical Exam Constitutional:      General: She is not in acute distress.    Appearance: Normal appearance. She is well-developed and normal weight. She is not ill-appearing or diaphoretic.  HENT:     Head: Normocephalic and atraumatic.     Right Ear: Tympanic membrane, ear canal and external ear normal.     Left Ear: Tympanic membrane, ear canal and external ear normal.     Nose: Nose normal. No congestion.     Mouth/Throat:     Mouth: Mucous membranes are moist.     Pharynx: Oropharynx is clear. No posterior oropharyngeal erythema.  Eyes:     General: No scleral icterus.    Extraocular Movements: Extraocular movements intact.     Conjunctiva/sclera: Conjunctivae normal.     Pupils: Pupils are equal, round, and reactive to light.  Neck:     Thyroid: No thyromegaly.     Vascular: No carotid bruit or JVD.  Cardiovascular:     Rate and Rhythm: Normal rate and regular rhythm.     Pulses: Normal pulses.     Heart sounds: Normal heart sounds. No gallop.   Pulmonary:     Effort: Pulmonary effort is normal. No respiratory distress.     Breath sounds: Normal breath sounds. No wheezing.     Comments: Good air exch Chest:     Chest wall: No tenderness.  Abdominal:     General: Bowel sounds are normal. There is no  distension or abdominal bruit.     Palpations: Abdomen is soft. There is no mass.     Tenderness: There is no abdominal tenderness.     Hernia: No hernia is present.  Genitourinary:    Comments: Breast and pelvic exam are done by gyn  Musculoskeletal:        General: No tenderness. Normal range of motion.     Cervical back: Normal range of motion and neck supple. No rigidity. No muscular tenderness.     Right lower leg: No edema.     Left lower leg: No edema.  Lymphadenopathy:     Cervical: No cervical adenopathy.  Skin:    General: Skin is warm and dry.     Coloration: Skin is not pale.     Findings: No erythema or rash.     Comments: Few lentigines Fair complexion   Neurological:     Mental Status: She is alert. Mental status is at baseline.     Cranial Nerves: No cranial nerve deficit.     Motor: No abnormal muscle tone.     Coordination: Coordination normal.     Gait: Gait normal.     Deep Tendon Reflexes: Reflexes are normal and symmetric. Reflexes normal.  Psychiatric:        Mood and Affect: Mood normal.        Cognition and Memory: Cognition and memory normal.           Assessment &  Plan:   Problem List Items Addressed This Visit      Other   Encounter for general adult medical examination with abnormal findings - Primary    Reviewed health habits including diet and exercise and skin cancer prevention Reviewed appropriate screening tests for age  Also reviewed health mt list, fam hx and immunization status , as well as social and family history   See HPI Flu shot given  Answered questions about the covid vaccine- -considering it  utd gyn care-sent for last pap report from Dr Corinna Capra (continues IUD) Continues derm f/u for h/o melanoma  Cholesterol is up significantly - counseled on this and diet and plan to start crestor 5 mg daily       Hyperlipidemia, mild    Disc goals for lipids and reasons to control them Rev last labs with pt Rev low sat fat diet in  detail LDL now over 160 Open to try crestor 5 mg daily (rev poss side eff) Will plan fasting check in 6 wk  Call if side eff Plans to cut back on high fat dairy       Relevant Medications   rosuvastatin (CRESTOR) 5 MG tablet    Other Visit Diagnoses    Need for influenza vaccination       Relevant Orders   Flu Vaccine QUAD 6+ mos PF IM (Fluarix Quad PF) (Completed)

## 2020-06-15 NOTE — Assessment & Plan Note (Addendum)
Reviewed health habits including diet and exercise and skin cancer prevention Reviewed appropriate screening tests for age  Also reviewed health mt list, fam hx and immunization status , as well as social and family history   See HPI Flu shot given  Answered questions about the covid vaccine- -considering it  utd gyn care-sent for last pap report from Dr Corinna Capra (continues IUD) Continues derm f/u for h/o melanoma  Cholesterol is up significantly - counseled on this and diet and plan to start crestor 5 mg daily

## 2020-07-19 ENCOUNTER — Telehealth: Payer: Self-pay | Admitting: Family Medicine

## 2020-07-19 DIAGNOSIS — E7800 Pure hypercholesterolemia, unspecified: Secondary | ICD-10-CM

## 2020-07-19 NOTE — Telephone Encounter (Signed)
-----   Message from Ellamae Sia sent at 07/13/2020  4:09 PM EST ----- Regarding: lab orders for Tuesday, 12.21.21 Lab orders, thanks

## 2020-07-28 ENCOUNTER — Other Ambulatory Visit: Payer: Managed Care, Other (non HMO)

## 2020-07-29 ENCOUNTER — Other Ambulatory Visit (INDEPENDENT_AMBULATORY_CARE_PROVIDER_SITE_OTHER): Payer: Managed Care, Other (non HMO)

## 2020-07-29 ENCOUNTER — Other Ambulatory Visit: Payer: Self-pay

## 2020-07-29 DIAGNOSIS — E7800 Pure hypercholesterolemia, unspecified: Secondary | ICD-10-CM | POA: Diagnosis not present

## 2020-07-30 LAB — LIPID PANEL
Chol/HDL Ratio: 4.1 ratio (ref 0.0–4.4)
Cholesterol, Total: 172 mg/dL (ref 100–199)
HDL: 42 mg/dL (ref >39)
LDL Chol Calc (NIH): 107 mg/dL — ABNORMAL HIGH (ref 0–99)
Triglycerides: 130 mg/dL (ref 0–149)
VLDL Cholesterol Cal: 23 mg/dL (ref 5–40)

## 2020-07-30 LAB — AST: AST: 16 IU/L (ref 0–40)

## 2020-07-30 LAB — ALT: ALT: 18 IU/L (ref 0–32)

## 2020-10-06 ENCOUNTER — Ambulatory Visit: Payer: Managed Care, Other (non HMO) | Admitting: Dermatology

## 2020-10-06 ENCOUNTER — Other Ambulatory Visit: Payer: Self-pay

## 2020-10-06 ENCOUNTER — Encounter: Payer: Self-pay | Admitting: Dermatology

## 2020-10-06 DIAGNOSIS — D2372 Other benign neoplasm of skin of left lower limb, including hip: Secondary | ICD-10-CM | POA: Diagnosis not present

## 2020-10-06 DIAGNOSIS — R609 Edema, unspecified: Secondary | ICD-10-CM

## 2020-10-06 DIAGNOSIS — L988 Other specified disorders of the skin and subcutaneous tissue: Secondary | ICD-10-CM

## 2020-10-06 MED ORDER — DOXYCYCLINE MONOHYDRATE 100 MG PO CAPS: 100.0000 mg | ORAL_CAPSULE | Freq: Two times a day (BID) | ORAL | 0 refills | Status: AC

## 2020-10-06 NOTE — Patient Instructions (Signed)
Doxycycline should be taken with food to prevent nausea. Do not lay down for 30 minutes after taking. Be cautious with sun exposure and use good sun protection while on this medication. Pregnant women should not take this medication.  

## 2020-10-06 NOTE — Progress Notes (Signed)
   Follow-Up Visit   Subjective  Caitlin Ward is a 40 y.o. female who presents for the following: Sore spot (Posterior neck/scalp soreness since Sunday. No recent illness/sickness. No history of cyst in this area previously.) and Growth (Left lower leg x 2 months. No symptoms.).  She has a h/o melanoma treated years ago. Patient would also like to be scheduled for Botox injections. Last Botox was 03/2019 with 20 units to the frown complex. She would like to have injections to the crow's feet as well.  The following portions of the chart were reviewed this encounter and updated as appropriate:       Review of Systems:  No other skin or systemic complaints except as noted in HPI or Assessment and Plan.  Objective  Well appearing patient in no apparent distress; mood and affect are within normal limits.  A focused examination was performed including face, scalp, left leg. Relevant physical exam findings are noted in the Assessment and Plan.  Objective  R occipital scalp hairline: Indistinct firm sub q nodule, no erythema, tender to touch  Objective  Frown complex, Crow's feet, forehead: Rhytides and volume loss.    Assessment & Plan   Dermatofibroma - Light tan firm dimpling nodule of the left lateral upper calf, 4.12mm. - Benign appearing, reassurance - Call for any changes   Swelling R occipital scalp hairline  Swollen lymph node vrs inflamed cyst  Start doxycycline 100mg  take 1 po BID with food x 2 weeks dsp #28 0Rf. Recheck on f/up  Doxycycline should be taken with food to prevent nausea. Do not lay down for 30 minutes after taking. Be cautious with sun exposure and use good sun protection while on this medication. Pregnant women should not take this medication.    doxycycline (MONODOX) 100 MG capsule - R occipital scalp hairline  Elastosis of skin Frown complex, Crow's feet, forehead  Plan Botox injections on follow-up.  45 units total = $585  20 units -  frown complex 20 units - crow's feet 5 units - forehead  Return March 21 at 2:30pm for TBSE, Botox, and f/u swelling. Ok per Dr Chauncey Cruel..   Documentation: I have reviewed the above documentation for accuracy and completeness, and I agree with the above.  Brendolyn Patty MD

## 2020-10-18 ENCOUNTER — Other Ambulatory Visit: Payer: Self-pay

## 2020-10-18 ENCOUNTER — Encounter: Payer: Self-pay | Admitting: Dermatology

## 2020-10-18 ENCOUNTER — Ambulatory Visit (INDEPENDENT_AMBULATORY_CARE_PROVIDER_SITE_OTHER): Payer: Managed Care, Other (non HMO) | Admitting: Dermatology

## 2020-10-18 DIAGNOSIS — Z86018 Personal history of other benign neoplasm: Secondary | ICD-10-CM

## 2020-10-18 DIAGNOSIS — L988 Other specified disorders of the skin and subcutaneous tissue: Secondary | ICD-10-CM | POA: Diagnosis not present

## 2020-10-18 DIAGNOSIS — L814 Other melanin hyperpigmentation: Secondary | ICD-10-CM

## 2020-10-18 DIAGNOSIS — L821 Other seborrheic keratosis: Secondary | ICD-10-CM

## 2020-10-18 DIAGNOSIS — D229 Melanocytic nevi, unspecified: Secondary | ICD-10-CM

## 2020-10-18 DIAGNOSIS — L578 Other skin changes due to chronic exposure to nonionizing radiation: Secondary | ICD-10-CM

## 2020-10-18 DIAGNOSIS — R609 Edema, unspecified: Secondary | ICD-10-CM

## 2020-10-18 DIAGNOSIS — Z1283 Encounter for screening for malignant neoplasm of skin: Secondary | ICD-10-CM

## 2020-10-18 DIAGNOSIS — Z85820 Personal history of malignant melanoma of skin: Secondary | ICD-10-CM | POA: Diagnosis not present

## 2020-10-18 DIAGNOSIS — L910 Hypertrophic scar: Secondary | ICD-10-CM

## 2020-10-18 DIAGNOSIS — D1800 Hemangioma unspecified site: Secondary | ICD-10-CM

## 2020-10-18 NOTE — Progress Notes (Signed)
Follow-Up Visit   Subjective  Caitlin Ward is a 40 y.o. female who presents for the following: Annual Exam (Hx MM L arm - 2009), Facial Elastosis (Patient is here for Botox today as previously discussed.), and swelling (R occipital hairline/scalp - S/P Doxycycline 100mg  po BID x 2 weeks. Swelling has improved, but has since traveled to an area behind her ear. She is concerned it may be a swollen lymph node and would like area checked again.). Not aware of any recent ear/sinus infection or URI.  No new or changing skin lesions other than swelling in neck.  The following portions of the chart were reviewed this encounter and updated as appropriate:      Review of Systems:  No other skin or systemic complaints except as noted in HPI or Assessment and Plan.  Objective  Well appearing patient in no apparent distress; mood and affect are within normal limits.  A full examination was performed including scalp, head, eyes, ears, nose, lips, neck, chest, axillae, abdomen, back, buttocks, bilateral upper extremities, bilateral lower extremities, hands, feet, fingers, toes, fingernails, and toenails. All findings within normal limits unless otherwise noted below.  Objective  Face: Rhytides and volume loss. Rhytides and volume loss.   Images                Objective  R post auricular, R post neck at hairline: Sq nodule, decreased in size from last visit R occipital neck, new SQ nodule R post auricular neck  Objective  Left Upper Arm - Posterior: Central pigmentation 0.6 cm   Assessment & Plan  Elastosis of skin Face  45 units total to glabella, forehead, crow's feet  Botox Injection - Face Location: See attached image  Informed consent: Discussed risks (infection, pain, bleeding, bruising, swelling, allergic reaction, paralysis of nearby muscles, eyelid droop, double vision, neck weakness, difficulty breathing, headache, undesirable cosmetic result, and need for  additional treatment) and benefits of the procedure, as well as the alternatives.  Informed consent was obtained.  Preparation: The area was cleansed with alcohol.  Procedure Details:  Botox was injected into the dermis with a 30-gauge needle. Pressure applied to any bleeding. Ice packs offered for swelling.  Lot Number:  [id number] Expiration:  11/2022  Total Units Injected:  45 units  Plan: Patient was instructed to remain upright for 4 hours. Patient was instructed to avoid massaging the face and avoid vigorous exercise for the rest of the day. Tylenol may be used for headache.  Allow 2 weeks before returning to clinic for additional dosing as needed. Patient will call for any problems.   Swelling R post auricular, R post neck at hairline  Likely swollen lymph nodes - no known hx of recent illness per patient  Finish Doxycycline 100mg  po BID. Doxycycline should be taken with food to prevent nausea. Do not lay down for 30 minutes after taking. Be cautious with sun exposure and use good sun protection while on this medication. Pregnant women should not take this medication.  If not improved in 4-6 week follow up with PCP.   Other Related Medications doxycycline (MONODOX) 100 MG capsule  Hypertrophic scar Left Upper Arm - Posterior  With Recurrent nevus - Benign-appearing.  Observation.  Call clinic for new or changing moles.  Recommend daily use of broad spectrum spf 30+ sunscreen to sun-exposed areas.     Lentigines - Scattered tan macules - Due to sun exposure - Benign-appering, observe - Recommend daily broad spectrum sunscreen SPF  30+ to sun-exposed areas, reapply every 2 hours as needed. - Call for any changes  Seborrheic Keratoses - Stuck-on, waxy, tan-brown papules and plaques  - Discussed benign etiology and prognosis. - Observe - Call for any changes  Melanocytic Nevi - Tan-brown and/or pink-flesh-colored symmetric macules and papules - Benign appearing on exam  today - Observation - Call clinic for new or changing moles - Recommend daily use of broad spectrum spf 30+ sunscreen to sun-exposed areas.   Hemangiomas - Red papules - Discussed benign nature - Observe - Call for any changes  Actinic Damage - Chronic, secondary to cumulative UV/sun exposure - diffuse scaly erythematous macules with underlying dyspigmentation - Recommend daily broad spectrum sunscreen SPF 30+ to sun-exposed areas, reapply every 2 hours as needed.  - Call for new or changing lesions.  History of Melanoma - L post upper arm 2009 - No evidence of recurrence today - Recommend regular full body skin exams - Recommend daily broad spectrum sunscreen SPF 30+ to sun-exposed areas, reapply every 2 hours as needed.  - Call if any new or changing lesions are noted between office visits  History of Dysplastic Nevus - R scapula mod to severe, excision 2009 - No evidence of recurrence today - Recommend regular full body skin exams - Recommend daily broad spectrum sunscreen SPF 30+ to sun-exposed areas, reapply every 2 hours as needed.  - Call if any new or changing lesions are noted between office visits  Skin cancer screening performed today.  Return in about 1 year (around 10/18/2021) for TBSE - hx MM, dysplastic nevus .  Luther Redo, CMA, am acting as scribe for Brendolyn Patty, MD .  Documentation: I have reviewed the above documentation for accuracy and completeness, and I agree with the above.  Brendolyn Patty MD

## 2020-10-18 NOTE — Patient Instructions (Signed)

## 2021-03-16 ENCOUNTER — Encounter: Payer: Self-pay | Admitting: Family Medicine

## 2021-06-15 ENCOUNTER — Telehealth: Payer: Self-pay | Admitting: Family Medicine

## 2021-06-15 DIAGNOSIS — E7800 Pure hypercholesterolemia, unspecified: Secondary | ICD-10-CM

## 2021-06-15 DIAGNOSIS — Z0000 Encounter for general adult medical examination without abnormal findings: Secondary | ICD-10-CM

## 2021-06-15 NOTE — Telephone Encounter (Signed)
Pt called in to schedule CPE and would like to have cpxlabs done at [facility] . Please advise

## 2021-06-15 NOTE — Telephone Encounter (Signed)
Lab orders are in  thanks

## 2021-06-15 NOTE — Telephone Encounter (Signed)
CPE is scheduled on 07/29/21, please put in lab orders and I can release them into the [facility] sxs

## 2021-06-15 NOTE — Telephone Encounter (Signed)
Orders released into their sxs and copy mailed to pt. Pt notified

## 2021-06-22 ENCOUNTER — Other Ambulatory Visit: Payer: Self-pay | Admitting: Family Medicine

## 2021-07-23 LAB — COMPREHENSIVE METABOLIC PANEL
ALT: 14 IU/L (ref 0–32)
AST: 17 IU/L (ref 0–40)
Albumin/Globulin Ratio: 2.2 (ref 1.2–2.2)
Albumin: 4.2 g/dL (ref 3.8–4.8)
Alkaline Phosphatase: 51 IU/L (ref 44–121)
BUN/Creatinine Ratio: 19 (ref 9–23)
BUN: 13 mg/dL (ref 6–20)
Bilirubin Total: 0.4 mg/dL (ref 0.0–1.2)
CO2: 24 mmol/L (ref 20–29)
Calcium: 8.9 mg/dL (ref 8.7–10.2)
Chloride: 101 mmol/L (ref 96–106)
Creatinine, Ser: 0.7 mg/dL (ref 0.57–1.00)
Globulin, Total: 1.9 g/dL (ref 1.5–4.5)
Glucose: 93 mg/dL (ref 70–99)
Potassium: 4.1 mmol/L (ref 3.5–5.2)
Sodium: 138 mmol/L (ref 134–144)
Total Protein: 6.1 g/dL (ref 6.0–8.5)
eGFR: 113 mL/min/{1.73_m2} (ref >59)

## 2021-07-23 LAB — CBC WITH DIFFERENTIAL/PLATELET
Basophils Absolute: 0 10*3/uL (ref 0.0–0.2)
Basos: 0 %
EOS (ABSOLUTE): 0.2 10*3/uL (ref 0.0–0.4)
Eos: 2 %
Hematocrit: 36.4 % (ref 34.0–46.6)
Hemoglobin: 12.6 g/dL (ref 11.1–15.9)
Immature Grans (Abs): 0 10*3/uL (ref 0.0–0.1)
Immature Granulocytes: 0 %
Lymphocytes Absolute: 1.9 10*3/uL (ref 0.7–3.1)
Lymphs: 28 %
MCH: 30.7 pg (ref 26.6–33.0)
MCHC: 34.6 g/dL (ref 31.5–35.7)
MCV: 89 fL (ref 79–97)
Monocytes Absolute: 0.5 10*3/uL (ref 0.1–0.9)
Monocytes: 7 %
Neutrophils Absolute: 4.4 10*3/uL (ref 1.4–7.0)
Neutrophils: 63 %
Platelets: 316 10*3/uL (ref 150–450)
RBC: 4.1 x10E6/uL (ref 3.77–5.28)
RDW: 11.8 % (ref 11.7–15.4)
WBC: 7 10*3/uL (ref 3.4–10.8)

## 2021-07-23 LAB — LIPID PANEL
Chol/HDL Ratio: 3.2 ratio (ref 0.0–4.4)
Cholesterol, Total: 153 mg/dL (ref 100–199)
HDL: 48 mg/dL (ref >39)
LDL Chol Calc (NIH): 89 mg/dL (ref 0–99)
Triglycerides: 84 mg/dL (ref 0–149)
VLDL Cholesterol Cal: 16 mg/dL (ref 5–40)

## 2021-07-23 LAB — TSH: TSH: 1.74 u[IU]/mL (ref 0.450–4.500)

## 2021-07-29 ENCOUNTER — Other Ambulatory Visit: Payer: Self-pay

## 2021-07-29 ENCOUNTER — Encounter: Payer: Self-pay | Admitting: Family Medicine

## 2021-07-29 ENCOUNTER — Ambulatory Visit (INDEPENDENT_AMBULATORY_CARE_PROVIDER_SITE_OTHER): Payer: Managed Care, Other (non HMO) | Admitting: Family Medicine

## 2021-07-29 VITALS — BP 126/72 | HR 61 | Temp 98.1°F | Ht 64.75 in | Wt 146.4 lb

## 2021-07-29 DIAGNOSIS — Z23 Encounter for immunization: Secondary | ICD-10-CM | POA: Diagnosis not present

## 2021-07-29 DIAGNOSIS — Z0000 Encounter for general adult medical examination without abnormal findings: Secondary | ICD-10-CM

## 2021-07-29 DIAGNOSIS — E7800 Pure hypercholesterolemia, unspecified: Secondary | ICD-10-CM | POA: Diagnosis not present

## 2021-07-29 MED ORDER — ROSUVASTATIN CALCIUM 5 MG PO TABS: ORAL_TABLET | ORAL | 3 refills | Status: DC

## 2021-07-29 MED ORDER — BUSPIRONE HCL 15 MG PO TABS: 15.0000 mg | ORAL_TABLET | Freq: Two times a day (BID) | ORAL | 1 refills | Status: DC | PRN

## 2021-07-29 NOTE — Patient Instructions (Addendum)
Mammograms start at 40    Flu shot today  Tdap shot today   Keep exercising and take care of yourself   Wear sunscreen  Keep up with dermatology visits   Stop up front to send for your last pap report   I recommend counseling /therapy for driving fear/anxiety  You can try buspar 7.5 mg as needed  If it makes you feel worse or if you have sedation or other side effect stop it and let us know    [facility] center [address], [place] [address]  [phone]  [facility]/ Dr Lennice Sites Address: [place] [place], [place] [address] Phone: [phone]  [facility] Address: [address], [place], [place] [address] Phone: [phone]  [facility] [address] [place],  [address] [phone]

## 2021-07-29 NOTE — Assessment & Plan Note (Signed)
Disc goals for lipids and reasons to control them Rev last labs with pt Rev low sat fat diet in detail Improved further with crestor 5 mg daily and diet  Will continue this

## 2021-07-29 NOTE — Assessment & Plan Note (Signed)
Reviewed health habits including diet and exercise and skin cancer prevention Reviewed appropriate screening tests for age  Also reviewed health mt list, fam hx and immunization status , as well as social and family history   See HPI Labs reviewed  Pap utd 3 y ago-sent to gyn for report (Dr Andris Baumann iud Tdap vaccine today  Flu vaccine today  Nl breast exam, will start mammograms at age 40  utd dermatology care/ uses sun protection  Enc her to continue exercise
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
## 2021-09-21 ENCOUNTER — Other Ambulatory Visit: Payer: Self-pay | Admitting: Family Medicine

## 2021-10-24 ENCOUNTER — Ambulatory Visit: Payer: Managed Care, Other (non HMO) | Admitting: Dermatology

## 2021-11-29 ENCOUNTER — Ambulatory Visit: Payer: Managed Care, Other (non HMO) | Admitting: Dermatology

## 2021-11-29 DIAGNOSIS — D2271 Melanocytic nevi of right lower limb, including hip: Secondary | ICD-10-CM

## 2021-11-29 DIAGNOSIS — L821 Other seborrheic keratosis: Secondary | ICD-10-CM

## 2021-11-29 DIAGNOSIS — L814 Other melanin hyperpigmentation: Secondary | ICD-10-CM

## 2021-11-29 DIAGNOSIS — L578 Other skin changes due to chronic exposure to nonionizing radiation: Secondary | ICD-10-CM

## 2021-11-29 DIAGNOSIS — Z85820 Personal history of malignant melanoma of skin: Secondary | ICD-10-CM

## 2021-11-29 DIAGNOSIS — L988 Other specified disorders of the skin and subcutaneous tissue: Secondary | ICD-10-CM

## 2021-11-29 DIAGNOSIS — Z1283 Encounter for screening for malignant neoplasm of skin: Secondary | ICD-10-CM

## 2021-11-29 DIAGNOSIS — D1800 Hemangioma unspecified site: Secondary | ICD-10-CM

## 2021-11-29 DIAGNOSIS — L564 Polymorphous light eruption: Secondary | ICD-10-CM

## 2021-11-29 DIAGNOSIS — D239 Other benign neoplasm of skin, unspecified: Secondary | ICD-10-CM

## 2021-11-29 DIAGNOSIS — D229 Melanocytic nevi, unspecified: Secondary | ICD-10-CM

## 2021-11-29 DIAGNOSIS — L905 Scar conditions and fibrosis of skin: Secondary | ICD-10-CM | POA: Diagnosis not present

## 2021-11-29 DIAGNOSIS — D2372 Other benign neoplasm of skin of left lower limb, including hip: Secondary | ICD-10-CM

## 2021-11-29 DIAGNOSIS — Z86018 Personal history of other benign neoplasm: Secondary | ICD-10-CM

## 2021-11-29 MED ORDER — MOMETASONE FUROATE 0.1 % EX CREA: TOPICAL_CREAM | CUTANEOUS | 1 refills | Status: DC

## 2021-11-29 NOTE — Progress Notes (Signed)
? ?Follow-Up Visit ?  ?Subjective  ?Caitlin Ward is a 41 y.o. female who presents for the following: Annual Exam (The patient presents for Total-Body Skin Exam (TBSE) for skin cancer screening and mole check.  The patient has spots, moles and lesions to be evaluated, some may be new or changing and the patient has concerns that these could be cancer. Patient does have hx of melanoma and dysplastic nevus. Patient advises she does have a spot at left lower leg that seems to have become more of a nodule over the last few months. She also would like to ask about breaking out after sun exposure. ). Patient advises she has recently over the last year started to break out in rash on chest and arms with sun exposure, happened when on cruise and another time this spring.  Very itchy ? ? ?The following portions of the chart were reviewed this encounter and updated as appropriate:  ?  ?  ? ?Review of Systems:  No other skin or systemic complaints except as noted in HPI or Assessment and Plan. ? ?Objective  ?Well appearing patient in no apparent distress; mood and affect are within normal limits. ? ?A full examination was performed including scalp, head, eyes, ears, nose, lips, neck, chest, axillae, abdomen, back, buttocks, bilateral upper extremities, bilateral lower extremities, hands, feet, fingers, toes, fingernails, and toenails. All findings within normal limits unless otherwise noted below. ? ?left upper calf ?4.0 mm light brown firm papule/nodule with dimpling ? ?trunk, extremities ?Clear today  ? ?left upper posterior arm ?Firm flesh papule with central speckled pigmentation within scar 0.6 cm  ? ? ? ? ?right anterior thigh ?4 x 3 mm medium brown macule at right anterior thigh, darker inferior ?2 mm medium dark brown macule at left spinal lower back ?3 mm brown macule, lighter center at left lateral thigh ? ? ? ? ? ? ?crows feet ?Rhytides and volume loss.  ? ? ? ?Assessment & Plan  ?Dermatofibroma ?left upper  calf ? ?Benign, observe.  Stable from previous visit 1 year ago ? 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 ?Clear today ? ?Chronic condition that recurs. Discussed rash tends to be more frequent in spring and may lessen over summer as skin 'hardens" to UV exposure ?Recommend daily broad spectrum (UVA/B) sunscreen with zinc and photoprotection with clothing when possible ? ?When flared, start mometasone cream 1-2 times daily as needed for rash. Avoid applying to face, groin, and axilla. Use as directed. Long-term use can cause thinning of the skin. ? ?Topical steroids (such as triamcinolone, fluocinolone, fluocinonide, mometasone, clobetasol, halobetasol, betamethasone, hydrocortisone) can cause thinning and lightening of the skin if they are used for too long in the same area. Your physician has selected the right strength medicine for your problem and area affected on the body. Please use your medication only as directed by your physician to prevent side effects.  ? ? ?mometasone (ELOCON) 0.1 % cream - trunk, extremities ?Apply 1-2 times daily as needed for rash. Avoid applying to face, groin, and axilla. Use as directed. Long-term use can cause thinning of the skin. ? ?Scar ?left upper posterior arm ? ?Bx proven benign nevus with recurrence ? ?Benign-appearing.  Stable. Observation.  Call clinic for new or changing moles.  Recommend daily use of broad spectrum spf 30+ sunscreen to sun-exposed areas.   ? ?Nevus ?right anterior thigh ? ?Benign-appearing.  Stable. Observation.  Call clinic for new or changing moles.  Recommend daily use of broad  spectrum spf 30+ sunscreen to sun-exposed areas.   ? 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 ?Discussed Botox, 20 units for crow's feet.  Pt may schedule ? ?History of Dysplastic Nevi ?- No evidence of recurrence today at right scapula, moderate to severe, excised 2009 ?- Recommend regular full body skin exams ?- Recommend daily broad spectrum sunscreen SPF 30+ to  sun-exposed areas, reapply every 2 hours as needed.  ?- Call if any new or changing lesions are noted between office visits ? ?History of Melanoma ?- No evidence of recurrence today at left arm, WLE 2009, clark's level IV, Breslow's 1.27m ?- Recommend regular full body skin exams ?- Recommend daily broad spectrum sunscreen SPF 30+ to sun-exposed areas, reapply every 2 hours as needed.  ?- Call if any new or changing lesions are noted between office visits ? ?Lentigines ?- Scattered tan macules ?- Due to sun exposure ?- Benign-appearing, observe ?- Recommend daily broad spectrum sunscreen SPF 30+ to sun-exposed areas, reapply every 2 hours as needed. ?- Call for any changes ? ?Seborrheic Keratoses ?- Stuck-on, waxy, tan-brown papules and/or plaques  ?- Benign-appearing ?- Discussed benign etiology and prognosis. ?- Observe ?- Call for any changes ? ?Melanocytic Nevi ?- Tan-brown and/or pink-flesh-colored symmetric macules and papules ?- Benign appearing on exam today ?- Observation ?- Call clinic for new or changing moles ?- Recommend daily use of broad spectrum spf 30+ sunscreen to sun-exposed areas.  ? ?Hemangiomas ?- Red papules ?- Discussed benign nature ?- Observe ?- Call for any changes ? ?Actinic Damage ?- Chronic condition, secondary to cumulative UV/sun exposure ?- diffuse scaly erythematous macules with underlying dyspigmentation ?- Recommend daily broad spectrum sunscreen SPF 30+ to sun-exposed areas, reapply every 2 hours as needed.  ?- Staying in the shade or wearing long sleeves, sun glasses (UVA+UVB protection) and wide brim hats (4-inch brim around the entire circumference of the hat) are also recommended for sun protection.  ?- Call for new or changing lesions. ? ?Skin cancer screening performed today. ? ?Return in about 1 year (around 11/30/2022) for TBSE. ? ?IGraciella Belton RMA, am acting as scribe for TBrendolyn Patty MD . ? ?Documentation: I have reviewed the above documentation for accuracy and  completeness, and I agree with the above. ? ?TBrendolyn PattyMD  ? ?

## 2021-11-29 NOTE — Patient Instructions (Signed)
Melanoma ABCDEs ? ?Melanoma is the most dangerous type of skin cancer, and is the leading cause of death from skin disease.  You are more likely to develop melanoma if you: ?Have light-colored skin, light-colored eyes, or red or blond hair ?Spend a lot of time in the sun ?Tan regularly, either outdoors or in a tanning bed ?Have had blistering sunburns, especially during childhood ?Have a close family member who has had a melanoma ?Have atypical moles or large birthmarks ? ?Early detection of melanoma is key since treatment is typically straightforward and cure rates are extremely high if we catch it early.  ? ?The first sign of melanoma is often a change in a mole or a new dark spot.  The ABCDE system is a way of remembering the signs of melanoma. ? ?A for asymmetry:  The two halves do not match. ?B for border:  The edges of the growth are irregular. ?C for color:  A mixture of colors are present instead of an even brown color. ?D for diameter:  Melanomas are usually (but not always) greater than 90m - the size of a pencil eraser. ?E for evolution:  The spot keeps changing in size, shape, and color. ? ?Please check your skin once per month between visits. You can use a small mirror in front and a large mirror behind you to keep an eye on the back side or your body.  ? ?If you see any new or changing lesions before your next follow-up, please call to schedule a visit. ? ?Please continue daily skin protection including broad spectrum sunscreen SPF 30+ to sun-exposed areas, reapplying every 2 hours as needed when you're outdoors.   ? ?If You Need Anything After Your Visit ? ?If you have any questions or concerns for your doctor, please call our main line at [phone] press option 4 to reach your doctor's medical assistant. If no one answers, please leave a voicemail as directed and we will return your call as soon as possible. Messages left after 4 pm will be answered the following business day.  ? ?You may also  send [place] message via MyChart. We typically respond to MyChart messages within 1-2 business days. ? ?For prescription refills, please ask your pharmacy to contact our office. Our fax number is [phone] ? ?If you have an urgent issue when the clinic is closed that cannot wait until the next business day, you can page your doctor at the number below.   ? ?Please note that while we do our best to be available for urgent issues outside of office hours, we are not available 24/7.  ? ?If you have an urgent issue and are unable to reach [place] you may choose to seek medical care at your doctor's office, retail clinic, urgent care center, or emergency room. ? ?If you have a medical emergency, please immediately call 911 or go to the emergency department. ? ?Pager Numbers ? ?- Dr. KNehemiah Massed [phone]? ?- Dr. MLaurence Ferrari [phone]? ?- Dr. SNicole Kindred [phone]? ?In the event of inclement weather, please call our main line at [phone] an update on the status of any delays or closures. ? ?Dermatology Medication Tips: ?Please keep the boxes that topical medications come in in order to help keep track of the instructions about where and how to use these. Pharmacies typically print the medication instructions only on the boxes and not directly on the medication tubes.  ? ?If your medication is too expensive, please contact  our office at [phone] option 4 or send [place] a message through [place].  ? ?We are unable to tell what your co-pay for medications will be in advance as this is different depending on your insurance coverage. However, we may be able to find a substitute medication at lower cost or fill out paperwork to get insurance to cover a needed medication.  ? ?If a prior authorization is required to get your medication covered by your insurance company, please allow [place] 1-2 business days to complete this process. ? ?Drug prices often vary depending on where the prescription is filled and some pharmacies may  offer cheaper prices. ? ?The website www.goodrx.com contains coupons for medications through different pharmacies. The prices here do not account for what the cost may be with help from insurance (it may be cheaper with your insurance), but the website can give you the price if you did not use any insurance.  ?- You can print the associated coupon and take it with your prescription to the pharmacy.  ?- You may also stop by our office during regular business hours and pick up a GoodRx coupon card.  ?- If you need your prescription sent electronically to a different pharmacy, notify our office through [facility] or by phone at [phone] option 4. ? ? ? ? ?Si Usted Necesita Algo Despu?s de Su Visita ? ?Tambi?n puede enviarnos un mensaje a trav?s de MyChart. Por lo general respondemos a los mensajes de MyChart en el transcurso de 1 a 2 d?as h?biles. ? ?Para renovar recetas, por favor pida a su farmacia que se ponga en contacto con nuestra oficina. Nuestro n?mero de fax es el [phone]. ? ?Si tiene un asunto urgente cuando la cl?nica est? cerrada y que no puede esperar hasta el siguiente d?a h?bil, puede llamar/localizar a su doctor(a) al n?mero que aparece a continuaci?n.  ? ?Por favor, tenga en cuenta que aunque hacemos todo lo posible para estar disponibles para asuntos urgentes fuera del horario de oficina, no estamos disponibles las 24 horas del d?a, los 7 d?as de la semana.  ? ?Si tiene un problema urgente y no puede comunicarse con nosotros, puede optar por buscar atenci?n m?dica  en el consultorio de su doctor(a), en una cl?nica privada, en un centro de atenci?n urgente o en una sala de emergencias. ? ?Si tiene Engineer, maintenance (IT) m?dica, por favor llame inmediatamente al 911 o vaya a la sala de emergencias. ? ?N?meros de b?per ? ?- Dr. Nehemiah Massed: [phone] ? ?- Dra. Moye: [phone] ? ?- Dra. Nicole Kindred: [phone] ? ?En caso de inclemencias del tiempo, por favor llame a nuestra l?nea principal al  [phone] para una actualizaci?n sobre el estado de cualquier retraso o cierre. ? ?Consejos para la medicaci?n en dermatolog?a: ?Por favor, guarde las cajas en las que vienen los medicamentos de uso t?pico para ayudarle a seguir las instrucciones sobre d?nde y c?mo usarlos. Las farmacias generalmente imprimen las instrucciones del medicamento s?lo en las cajas y no directamente en los tubos del [place].  ? ?Si su medicamento es muy caro, por favor, p?ngase en contacto con Zigmund Daniel llamando al [phone] y presione la opci?n 4 o env?enos un mensaje a trav?s de MyChart.  ? ?No podemos decirle cu?l ser? su copago por los medicamentos por adelantado ya que esto es diferente dependiendo de la cobertura de su seguro. Sin embargo, es posible que podamos encontrar un medicamento sustituto a Electrical engineer un formulario para que el seguro cubra el medicamento  que se considera necesario.  ? ?Si se requiere Ardelia Mems autorizaci?n previa para que su compa??a de seguros [place] su medicamento, por favor perm?tanos de 1 a 2 d?as h?biles para completar este proceso. ? ?Los precios de los medicamentos var?an con frecuencia dependiendo del Environmental consultant de d?nde se surte la receta y alguna farmacias pueden ofrecer precios m?s baratos. ? ?El sitio web www.goodrx.com tiene cupones para medicamentos de Airline pilot. Los precios aqu? no tienen en cuenta lo que podr?a costar con la ayuda del seguro (puede ser m?s barato con su seguro), pero el sitio web puede darle el precio si no utiliz? ning?n seguro.  ?- Puede imprimir el cup?n correspondiente y llevarlo con su receta a la farmacia.  ?- Tambi?n puede pasar por nuestra oficina durante el horario de atenci?n regular y recoger una tarjeta de cupones de GoodRx.  ?- Si necesita que su receta se env?e electr?nicamente a Chiropodist, informe a nuestra oficina a trav?s de MyChart de [place] o por tel?fono llamando al [phone] y presione la opci?n 4. ? ?

## 2022-01-10 ENCOUNTER — Encounter: Payer: Self-pay | Admitting: Family Medicine

## 2022-01-10 ENCOUNTER — Ambulatory Visit: Payer: Managed Care, Other (non HMO) | Admitting: Family Medicine

## 2022-01-10 ENCOUNTER — Ambulatory Visit (INDEPENDENT_AMBULATORY_CARE_PROVIDER_SITE_OTHER)
Admission: RE | Admit: 2022-01-10 | Discharge: 2022-01-10 | Disposition: A | Discharge status: Home / Self Care | Payer: Managed Care, Other (non HMO) | Source: Ambulatory Visit | Attending: Family Medicine | Admitting: Family Medicine

## 2022-01-10 VITALS — BP 124/68 | HR 79 | Temp 98.7°F | Ht 64.0 in | Wt 147.0 lb

## 2022-01-10 DIAGNOSIS — R109 Unspecified abdominal pain: Secondary | ICD-10-CM

## 2022-01-10 DIAGNOSIS — R1032 Left lower quadrant pain: Secondary | ICD-10-CM

## 2022-01-10 DIAGNOSIS — R140 Abdominal distension (gaseous): Secondary | ICD-10-CM | POA: Diagnosis not present

## 2022-01-10 NOTE — Progress Notes (Signed)
Subjective:    Patient ID: Caitlin Ward, female    DOB: 1981-03-02, 41 y.o.   MRN: [id number]  HPI Pt presents for abdominal discomfort   Wt Readings from Last 3 Encounters:  01/10/22 147 lb (66.7 kg)  07/29/21 146 lb 6 oz (66.4 kg)  06/15/20 144 lb 6 oz (65.5 kg)   25.23 kg/m  6 months  ? Food intolerance / kept a food diary  No fried foods or red meat  Sensitive to certain breads and pastas   Neg celiac testing in the past for bloating    Under belly button it burns  Usually very soon after eating  Lasts around 30 minutes (moderate)  Does not double her over   No burping or belching More lower abd gas  Some bloating   No nausea No vomiting  No upper pain  Occ heart burn   Takes a probiotic - 2 weeks /not really helping   No stool changes  Some cramping however  No blood in stool     Remote h/o melanoma   IUD for constipation  Menses- does not have  Did have a yeast infection 3 weeks ago   No bowel problems in the family  No GI cancer in the family   Patient Active Problem List   Diagnosis Date Noted   Left lateral abdominal pain 01/10/2022   Bloating 01/10/2022   Routine general medical examination at a health care facility 06/15/2021   Abnormal MRI, cervical spine 02/05/2015   Small fiber neuropathy 11/18/2014   Temperature intolerance 09/28/2014   Hyperlipidemia 09/19/2013   Encounter for routine gynecological examination 07/30/2012   Encounter for general adult medical examination with abnormal findings 07/21/2012   Myofascial pain 05/28/2012   Hematuria, microscopic 10/23/2011   SLEEP DISORDER 04/27/2010   DYSMENORRHEA 02/27/2007   Past Medical History:  Diagnosis Date   Dysplastic nevus 06/15/2008   R scapula mod - severe, excision    Malignant melanoma ([place]) 2009   left arm- wide excision, clarks level 4, breslow's 1.59m   Skin cancer    Past Surgical History:  Procedure Laterality Date   BREAST ENHANCEMENT SURGERY      Social History   Tobacco Use   Smoking status: Never   Smokeless tobacco: Never  Substance Use Topics   Alcohol use: Yes    Alcohol/week: 0.0 standard drinks of alcohol    Comment: Occ   Drug use: No   Family History  Problem Relation Age of Onset   Coronary artery disease Maternal Grandfather    Hypertension Paternal Grandfather    Nephrolithiasis Mother    Allergies  Allergen Reactions   Penicillins     REACTION: As a small child does not remember exact reaction.   Current Outpatient Medications on File Prior to Visit  Medication Sig Dispense Refill   busPIRone (BUSPAR) 15 MG tablet Take 1 tablet (15 mg total) by mouth 2 (two) times daily as needed. Anxiety 30 tablet 1   IUD'S IU by Intrauterine route.     mometasone (ELOCON) 0.1 % cream Apply 1-2 times daily as needed for rash. Avoid applying to face, groin, and axilla. Use as directed. Long-term use can cause thinning of the skin. 45 g 1   rosuvastatin (CRESTOR) 5 MG tablet TAKE 1 TABKET BY MOUTH EVERYDAY 90 tablet 3   No current facility-administered medications on file prior to visit.    Review of Systems  Constitutional:  Negative for activity change, appetite change, fatigue,  fever and unexpected weight change.  HENT:  Negative for congestion, ear pain, rhinorrhea, sinus pressure and sore throat.   Eyes:  Negative for pain, redness and visual disturbance.  Respiratory:  Negative for cough, shortness of breath and wheezing.   Cardiovascular:  Negative for chest pain and palpitations.  Gastrointestinal:  Positive for abdominal distention and abdominal pain. Negative for anal bleeding, blood in stool, constipation, diarrhea, nausea, rectal pain and vomiting.  Endocrine: Negative for polydipsia and polyuria.  Genitourinary:  Negative for dysuria, frequency and urgency.  Musculoskeletal:  Negative for arthralgias, back pain and myalgias.  Skin:  Negative for pallor and rash.  Allergic/Immunologic: Negative for  environmental allergies.  Neurological:  Negative for dizziness, syncope and headaches.  Hematological:  Negative for adenopathy. Does not bruise/bleed easily.  Psychiatric/Behavioral:  Negative for decreased concentration and dysphoric mood. The patient is not nervous/anxious.        Objective:   Physical Exam Constitutional:      General: She is not in acute distress.    Appearance: She is well-developed and normal weight.  HENT:     Head: Normocephalic and atraumatic.  Eyes:     Conjunctiva/sclera: Conjunctivae normal.     Pupils: Pupils are equal, round, and reactive to light.  Neck:     Thyroid: No thyromegaly.     Vascular: No carotid bruit or JVD.  Cardiovascular:     Rate and Rhythm: Normal rate and regular rhythm.     Heart sounds: Normal heart sounds.     No gallop.  Pulmonary:     Effort: Pulmonary effort is normal. No respiratory distress.     Breath sounds: Normal breath sounds. No wheezing or rales.  Abdominal:     General: Abdomen is flat. Bowel sounds are normal. There is no distension or abdominal bruit.     Palpations: Abdomen is soft. There is no hepatomegaly, splenomegaly or mass.     Tenderness: There is abdominal tenderness in the right lower quadrant, suprapubic area and left lower quadrant. There is no right CVA tenderness, left CVA tenderness, guarding or rebound. Negative signs include Murphy's sign and McBurney's sign.  Musculoskeletal:     Cervical back: Normal range of motion and neck supple.     Right lower leg: No edema.     Left lower leg: No edema.  Lymphadenopathy:     Cervical: No cervical adenopathy.  Skin:    General: Skin is warm and dry.     Coloration: Skin is not jaundiced or pale.     Findings: No rash.  Neurological:     Mental Status: She is alert.     Coordination: Coordination normal.     Deep Tendon Reflexes: Reflexes are normal and symmetric. Reflexes normal.  Psychiatric:        Mood and Affect: Mood normal.            Assessment & Plan:   Problem List Items Addressed This Visit       Other   Bloating   Left lateral abdominal pain    Low abd pain worse on L in pt with past h/o melanoma Also bloating and ? Food intolerances but no bowel changes  abd xr  Trial of citrucel for a week and fluids Continue food diary  Lab today   Low threshold for further imaging in light of melanoma history       Relevant Orders   DG Abd 2 Views   Basic metabolic panel  CBC with Differential/Platelet   Hepatic function panel   Lipase

## 2022-01-10 NOTE — Patient Instructions (Signed)
Continue lots of fluids and a food diary   Get citrucel fiber supplement -mix with water daily for a week Continue if it helps   Stop the probiotic if it does not help   Labs today  Abdominal film today   May consider CT or GI consult

## 2022-01-10 NOTE — Assessment & Plan Note (Signed)
Low abd pain worse on L in pt with past h/o melanoma Also bloating and ? Food intolerances but no bowel changes  abd xr  Trial of citrucel for a week and fluids Continue food diary  Lab today   Low threshold for further imaging in light of melanoma history

## 2022-01-11 LAB — BASIC METABOLIC PANEL
BUN/Creatinine Ratio: 18 (ref 9–23)
BUN: 12 mg/dL (ref 6–24)
CO2: 24 mmol/L (ref 20–29)
Calcium: 9.3 mg/dL (ref 8.7–10.2)
Chloride: 102 mmol/L (ref 96–106)
Creatinine, Ser: 0.68 mg/dL (ref 0.57–1.00)
Glucose: 86 mg/dL (ref 70–99)
Potassium: 4.5 mmol/L (ref 3.5–5.2)
Sodium: 142 mmol/L (ref 134–144)
eGFR: 113 mL/min/{1.73_m2} (ref >59)

## 2022-01-11 LAB — HEPATIC FUNCTION PANEL
ALT: 13 IU/L (ref 0–32)
AST: 16 IU/L (ref 0–40)
Albumin: 4.4 g/dL (ref 3.8–4.8)
Alkaline Phosphatase: 51 IU/L (ref 44–121)
Bilirubin Total: 0.3 mg/dL (ref 0.0–1.2)
Bilirubin, Direct: <0.1 mg/dL (ref 0.00–0.40)
Total Protein: 6.5 g/dL (ref 6.0–8.5)

## 2022-01-11 LAB — CBC WITH DIFFERENTIAL/PLATELET
Basophils Absolute: 0 10*3/uL (ref 0.0–0.2)
Basos: 1 %
EOS (ABSOLUTE): 0.2 10*3/uL (ref 0.0–0.4)
Eos: 2 %
Hematocrit: 39.5 % (ref 34.0–46.6)
Hemoglobin: 13.9 g/dL (ref 11.1–15.9)
Immature Grans (Abs): 0 10*3/uL (ref 0.0–0.1)
Immature Granulocytes: 0 %
Lymphocytes Absolute: 2.3 10*3/uL (ref 0.7–3.1)
Lymphs: 29 %
MCH: 31.7 pg (ref 26.6–33.0)
MCHC: 35.2 g/dL (ref 31.5–35.7)
MCV: 90 fL (ref 79–97)
Monocytes Absolute: 0.5 10*3/uL (ref 0.1–0.9)
Monocytes: 6 %
Neutrophils Absolute: 4.9 10*3/uL (ref 1.4–7.0)
Neutrophils: 62 %
Platelets: 349 10*3/uL (ref 150–450)
RBC: 4.38 x10E6/uL (ref 3.77–5.28)
RDW: 12.2 % (ref 11.7–15.4)
WBC: 8 10*3/uL (ref 3.4–10.8)

## 2022-01-11 LAB — LIPASE: Lipase: 40 U/L (ref 14–72)

## 2022-01-12 NOTE — Addendum Note (Signed)
Addended by: Loura Pardon A on: 01/12/2022 09:24 AM   Modules accepted: Orders

## 2022-01-30 NOTE — Addendum Note (Signed)
Addended by: Francella Solian on: 01/30/2022 11:43 AM   Modules accepted: Orders

## 2022-02-09 ENCOUNTER — Ambulatory Visit
Admission: RE | Admit: 2022-02-09 | Discharge: 2022-02-09 | Disposition: A | Discharge status: Home / Self Care | Payer: Managed Care, Other (non HMO) | Source: Ambulatory Visit | Attending: Family Medicine | Admitting: Family Medicine

## 2022-02-09 DIAGNOSIS — R1032 Left lower quadrant pain: Secondary | ICD-10-CM

## 2022-02-09 MED ORDER — IOPAMIDOL (ISOVUE-300) INJECTION 61%
100.0000 mL | Freq: Once | INTRAVENOUS | Status: AC | PRN
  Administered 2022-02-09: 100 mL via INTRAVENOUS

## 2022-02-10 ENCOUNTER — Telehealth: Payer: Self-pay

## 2022-02-10 ENCOUNTER — Encounter: Payer: Self-pay | Admitting: Family Medicine

## 2022-02-10 DIAGNOSIS — R140 Abdominal distension (gaseous): Secondary | ICD-10-CM

## 2022-02-10 DIAGNOSIS — R109 Unspecified abdominal pain: Secondary | ICD-10-CM

## 2022-02-10 NOTE — Telephone Encounter (Signed)
-----   Message from Abner Greenspan, MD sent at 02/09/2022  5:37 PM EDT ----- Reassuring CT  Some constipation noted No cause found for pain  I would like to refer you to GI for the ongoing symptoms  Do you prefer a particular practice ?

## 2022-02-10 NOTE — Telephone Encounter (Signed)
I left a message for the patient to return my call.

## 2022-06-28 ENCOUNTER — Ambulatory Visit: Payer: Managed Care, Other (non HMO) | Admitting: Gastroenterology

## 2022-07-25 ENCOUNTER — Telehealth: Payer: Self-pay | Admitting: Family Medicine

## 2022-07-25 DIAGNOSIS — E7800 Pure hypercholesterolemia, unspecified: Secondary | ICD-10-CM

## 2022-07-25 DIAGNOSIS — Z0000 Encounter for general adult medical examination without abnormal findings: Secondary | ICD-10-CM

## 2022-07-25 NOTE — Telephone Encounter (Signed)
-----   Message from Velna Hatchet, RT sent at 07/10/2022  1:08 PM EST ----- Regarding: Wed 12/27 lab Patient is scheduled for cpx, please order future labs.  Thanks, Anda Kraft

## 2022-07-26 ENCOUNTER — Other Ambulatory Visit: Payer: Managed Care, Other (non HMO)

## 2022-07-28 ENCOUNTER — Ambulatory Visit
Admission: EM | Admit: 2022-07-28 | Discharge: 2022-07-28 | Disposition: A | Discharge status: Home / Self Care | Payer: Managed Care, Other (non HMO) | Attending: Emergency Medicine | Admitting: Emergency Medicine

## 2022-07-28 ENCOUNTER — Other Ambulatory Visit (INDEPENDENT_AMBULATORY_CARE_PROVIDER_SITE_OTHER): Payer: Managed Care, Other (non HMO)

## 2022-07-28 DIAGNOSIS — J0100 Acute maxillary sinusitis, unspecified: Secondary | ICD-10-CM

## 2022-07-28 DIAGNOSIS — E7800 Pure hypercholesterolemia, unspecified: Secondary | ICD-10-CM

## 2022-07-28 DIAGNOSIS — Z0000 Encounter for general adult medical examination without abnormal findings: Secondary | ICD-10-CM

## 2022-07-28 MED ORDER — CEFDINIR 300 MG PO CAPS: 300.0000 mg | ORAL_CAPSULE | Freq: Two times a day (BID) | ORAL | 0 refills | Status: AC

## 2022-07-28 NOTE — ED Provider Notes (Signed)
Caitlin Ward    CSN: [id number] Arrival date & time: 07/28/22  1194      History   Chief Complaint Chief Complaint  Patient presents with   Nasal Congestion    Entered by patient    HPI Caitlin Ward is a 41 y.o. female.  Patient presents with 10-day history of nasal congestion, sinus pressure, sinus pain, postnasal drip, runny nose, cough.  Treatment at home with Mucinex, Tylenol, ibuprofen.  No fever, sore throat, shortness of breath, vomiting, diarrhea, or other symptoms.  Her medical history includes melanoma, dysmenorrhea, sleep disorder, hyperlipidemia, neuropathy.  The history is provided by the patient and medical records.    Past Medical History:  Diagnosis Date   Dysplastic nevus 06/15/2008   R scapula mod - severe, excision    Malignant melanoma ([place]) 2009   left arm- wide excision, clarks level 4, breslow's 1.78m   Skin cancer     Patient Active Problem List   Diagnosis Date Noted   Left lateral abdominal pain 01/10/2022   Bloating 01/10/2022   Routine general medical examination at a health care facility 06/15/2021   Abnormal MRI, cervical spine 02/05/2015   Small fiber neuropathy 11/18/2014   Temperature intolerance 09/28/2014   Hyperlipidemia 09/19/2013   Encounter for routine gynecological examination 07/30/2012   Encounter for general adult medical examination with abnormal findings 07/21/2012   Myofascial pain 05/28/2012   Hematuria, microscopic 10/23/2011   SLEEP DISORDER 04/27/2010   DYSMENORRHEA 02/27/2007    Past Surgical History:  Procedure Laterality Date   BREAST ENHANCEMENT SURGERY      OB History   No obstetric history on file.      Home Medications    Prior to Admission medications   Medication Sig Start Date End Date Taking? Authorizing Provider  cefdinir (OMNICEF) 300 MG capsule Take 1 capsule (300 mg total) by mouth 2 (two) times daily for 10 days. 07/28/22 08/07/22 Yes TSharion Balloon NP  busPIRone (BUSPAR)  15 MG tablet Take 1 tablet (15 mg total) by mouth 2 (two) times daily as needed. Anxiety 07/29/21   Tower, MWynelle Fanny MD  IUD'S IU by Intrauterine route.    [provider]  mometasone (ELOCON) 0.1 % cream Apply 1-2 times daily as needed for rash. Avoid applying to face, groin, and axilla. Use as directed. Long-term use can cause thinning of the skin. 11/29/21   SBrendolyn Patty MD  rosuvastatin (CRESTOR) 5 MG tablet TAKE 1 TABKET BY MOUTH EVERYDAY 07/29/21   Tower, MWynelle Fanny MD    Family History Family History  Problem Relation Age of Onset   Coronary artery disease Maternal Grandfather    Hypertension Paternal Grandfather    Nephrolithiasis Mother     Social History Social History   Tobacco Use   Smoking status: Never   Smokeless tobacco: Never  Substance Use Topics   Alcohol use: Yes    Alcohol/week: 0.0 standard drinks of alcohol    Comment: Occ   Drug use: No     Allergies   Penicillins   Review of Systems Review of Systems  Constitutional:  Negative for chills and fever.  HENT:  Positive for congestion, postnasal drip, rhinorrhea, sinus pressure and sinus pain. Negative for ear pain and sore throat.   Respiratory:  Positive for cough. Negative for shortness of breath.   Cardiovascular:  Negative for chest pain and palpitations.  Gastrointestinal:  Negative for diarrhea and vomiting.  Skin:  Negative for color change and rash.  All other systems reviewed and are negative.    Physical Exam Triage Vital Signs ED Triage Vitals [07/28/22 0947]  Enc Vitals Group     BP      Pulse      Resp      Temp      Temp src      SpO2      Weight 148 lb (67.1 kg)     Height '5\' 4"'$  (1.626 m)     Head Circumference      Peak Flow      Pain Score 7     Pain Loc      Pain Edu?      Excl. in [place]?    No data found.  Updated Vital Signs BP 108/75   Pulse 79   Temp 98.8 F (37.1 C)   Resp 18   Ht '5\' 4"'$  (1.626 m)   Wt 148 lb (67.1 kg)   SpO2 95%   BMI 25.40 kg/m    Visual Acuity Right Eye Distance:   Left Eye Distance:   Bilateral Distance:    Right Eye Near:   Left Eye Near:    Bilateral Near:     Physical Exam Vitals and nursing note reviewed.  Constitutional:      General: She is not in acute distress.    Appearance: Normal appearance. She is well-developed. She is not ill-appearing.  HENT:     Head: Normocephalic and atraumatic.     Right Ear: Tympanic membrane normal.     Left Ear: Tympanic membrane normal.     Nose: Congestion and rhinorrhea present.     Mouth/Throat:     Mouth: Mucous membranes are moist.     Pharynx: Oropharynx is clear.  Cardiovascular:     Rate and Rhythm: Normal rate and regular rhythm.     Heart sounds: Normal heart sounds.  Pulmonary:     Effort: Pulmonary effort is normal. No respiratory distress.     Breath sounds: Normal breath sounds.  Musculoskeletal:     Cervical back: Neck supple.  Skin:    General: Skin is warm and dry.  Neurological:     Mental Status: She is alert.  Psychiatric:        Mood and Affect: Mood normal.        Behavior: Behavior normal.      UC Treatments / Results  Labs (all labs ordered are listed, but only abnormal results are displayed) Labs Reviewed - No data to display  EKG   Radiology No results found.  Procedures Procedures (including critical care time)  Medications Ordered in UC Medications - No data to display  Initial Impression / Assessment and Plan / UC Course  I have reviewed the triage vital signs and the nursing notes.  Pertinent labs & imaging results that were available during my care of the patient were reviewed by me and considered in my medical decision making (see chart for details).    Acute sinusitis.  Patient has been symptomatic for 10 days and is not improving with OTC treatment.  Treating today with cefdinir.  Discussed symptomatic treatment including Tylenol or ibuprofen.  Instructed patient to follow up with her PCP if symptoms  are not improving.  She agrees to plan of care.   Final Clinical Impressions(s) / UC Diagnoses   Final diagnoses:  Acute non-recurrent maxillary sinusitis     Discharge Instructions      Take the cefdinir as directed.  Follow  up with your primary care provider if your symptoms are not improving.        ED Prescriptions     Medication Sig Dispense Auth. Provider   cefdinir (OMNICEF) 300 MG capsule Take 1 capsule (300 mg total) by mouth 2 (two) times daily for 10 days. 20 capsule Sharion Balloon, NP      PDMP not reviewed this encounter.   Sharion Balloon, NP 07/28/22 1027

## 2022-07-28 NOTE — ED Triage Notes (Signed)
Patient to Urgent Care with complaints of nasal congestion x10 days. Reports over the last several days symptoms have progressed to facial pain/ bilateral eye pain/ teeth pain.  Denies any known fevers.  Productive cough with discolored mucus.  Has been taking ibuprofen/ tylenol/ mucinex.

## 2022-07-28 NOTE — Discharge Instructions (Addendum)
Take the cefdinir as directed.  Follow up with your primary care provider if your symptoms are not improving.    

## 2022-07-29 LAB — CBC WITH DIFFERENTIAL/PLATELET
Basophils Absolute: 0 10*3/uL (ref 0.0–0.2)
Basos: 0 %
EOS (ABSOLUTE): 0.1 10*3/uL (ref 0.0–0.4)
Eos: 1 %
Hematocrit: 37.8 % (ref 34.0–46.6)
Hemoglobin: 13 g/dL (ref 11.1–15.9)
Immature Grans (Abs): 0 10*3/uL (ref 0.0–0.1)
Immature Granulocytes: 0 %
Lymphocytes Absolute: 1.6 10*3/uL (ref 0.7–3.1)
Lymphs: 15 %
MCH: 30.2 pg (ref 26.6–33.0)
MCHC: 34.4 g/dL (ref 31.5–35.7)
MCV: 88 fL (ref 79–97)
Monocytes Absolute: 0.6 10*3/uL (ref 0.1–0.9)
Monocytes: 6 %
Neutrophils Absolute: 8.1 10*3/uL — ABNORMAL HIGH (ref 1.4–7.0)
Neutrophils: 78 %
Platelets: 275 10*3/uL (ref 150–450)
RBC: 4.31 x10E6/uL (ref 3.77–5.28)
RDW: 12 % (ref 11.7–15.4)
WBC: 10.5 10*3/uL (ref 3.4–10.8)

## 2022-07-29 LAB — TSH: TSH: 2.18 u[IU]/mL (ref 0.450–4.500)

## 2022-07-29 LAB — COMPREHENSIVE METABOLIC PANEL
ALT: 29 IU/L (ref 0–32)
AST: 27 IU/L (ref 0–40)
Albumin/Globulin Ratio: 1.8 (ref 1.2–2.2)
Albumin: 4.1 g/dL (ref 3.9–4.9)
Alkaline Phosphatase: 64 IU/L (ref 44–121)
BUN/Creatinine Ratio: 15 (ref 9–23)
BUN: 10 mg/dL (ref 6–24)
Bilirubin Total: 0.4 mg/dL (ref 0.0–1.2)
CO2: 25 mmol/L (ref 20–29)
Calcium: 9.1 mg/dL (ref 8.7–10.2)
Chloride: 104 mmol/L (ref 96–106)
Creatinine, Ser: 0.67 mg/dL (ref 0.57–1.00)
Globulin, Total: 2.3 g/dL (ref 1.5–4.5)
Glucose: 102 mg/dL — ABNORMAL HIGH (ref 70–99)
Potassium: 4.9 mmol/L (ref 3.5–5.2)
Sodium: 141 mmol/L (ref 134–144)
Total Protein: 6.4 g/dL (ref 6.0–8.5)
eGFR: 113 mL/min/{1.73_m2} (ref >59)

## 2022-07-29 LAB — LIPID PANEL
Chol/HDL Ratio: 3.5 ratio (ref 0.0–4.4)
Cholesterol, Total: 157 mg/dL (ref 100–199)
HDL: 45 mg/dL (ref >39)
LDL Chol Calc (NIH): 94 mg/dL (ref 0–99)
Triglycerides: 100 mg/dL (ref 0–149)
VLDL Cholesterol Cal: 18 mg/dL (ref 5–40)

## 2022-08-02 ENCOUNTER — Encounter: Payer: Self-pay | Admitting: Family Medicine

## 2022-08-02 ENCOUNTER — Ambulatory Visit (INDEPENDENT_AMBULATORY_CARE_PROVIDER_SITE_OTHER): Payer: Managed Care, Other (non HMO) | Admitting: Family Medicine

## 2022-08-02 VITALS — BP 104/62 | HR 50 | Temp 97.2°F | Ht 65.0 in | Wt 148.5 lb

## 2022-08-02 DIAGNOSIS — G629 Polyneuropathy, unspecified: Secondary | ICD-10-CM | POA: Diagnosis not present

## 2022-08-02 DIAGNOSIS — E7800 Pure hypercholesterolemia, unspecified: Secondary | ICD-10-CM

## 2022-08-02 DIAGNOSIS — Z1231 Encounter for screening mammogram for malignant neoplasm of breast: Secondary | ICD-10-CM | POA: Diagnosis not present

## 2022-08-02 DIAGNOSIS — J019 Acute sinusitis, unspecified: Secondary | ICD-10-CM | POA: Insufficient documentation

## 2022-08-02 DIAGNOSIS — Z0000 Encounter for general adult medical examination without abnormal findings: Secondary | ICD-10-CM

## 2022-08-02 DIAGNOSIS — J0100 Acute maxillary sinusitis, unspecified: Secondary | ICD-10-CM

## 2022-08-02 MED ORDER — BUSPIRONE HCL 15 MG PO TABS: 15.0000 mg | ORAL_TABLET | Freq: Two times a day (BID) | ORAL | 3 refills | Status: AC | PRN

## 2022-08-02 MED ORDER — PREDNISONE 20 MG PO TABS: 20.0000 mg | ORAL_TABLET | Freq: Every day | ORAL | 0 refills | Status: DC

## 2022-08-02 MED ORDER — ROSUVASTATIN CALCIUM 5 MG PO TABS: ORAL_TABLET | ORAL | 3 refills | Status: DC

## 2022-08-02 NOTE — Assessment & Plan Note (Signed)
Disc goals for lipids and reasons to control them Rev last labs with pt Rev low sat fat diet in detail  Crestor 5 mg daily  Tolerating well  LDL under 100  Good habits

## 2022-08-02 NOTE — Patient Instructions (Addendum)
Try the 20 mg of prednisone daily for 5 days to see if this helps the congestion with sinus infection  Finish the antibiotic  Avoid afrin   Get a flu shot when you feel better   Call and schedule your mammogram at the breast center of [facility]   Please call the location of your choice from the menu below to schedule your Mammogram and/or Bone Density appointment.    [facility]                      Phone:  [phone], [place] [address]                                                             Services: Traditional and 3D Mammogram, [place] [facility]                 Phone: [phone], [place] [address]    Service: Bone Density ONLY   *this site does NOT perform mammograms  [facility]                        Phone:  [phone] [address], [place] [address]                                            Services:  3D Mammogram and [place] at [facility]   Phone:  [phone]   [place], [place] [address]                                            Services: 3D Mammogram and [place]  [facility] at [facility])  Phone:  [phone]   [address]                        [place], [place] [address]                                              Services:  3D Mammogram and Bone Density   Eat healthy  Keep exercising   Labs look ok/ stable

## 2022-08-02 NOTE — Assessment & Plan Note (Signed)
First mammogram ordered Pt will call to schedule

## 2022-08-02 NOTE — Assessment & Plan Note (Signed)
Per pt fairly stable

## 2022-08-02 NOTE — Assessment & Plan Note (Signed)
Reviewed health habits including diet and exercise and skin cancer prevention Reviewed appropriate screening tests for age  Also reviewed health mt list, fam hx and immunization status , as well as social and family history   See HPI Labs reviewed  Flu shot given  Pap will be this summer/up to date  Mammogram ordered, pt will call to schedule  Utd derm care in setting of past melanoma Enc further good diet and exercise

## 2022-08-02 NOTE — Progress Notes (Signed)
Subjective:    Patient ID: Caitlin Ward, female    DOB: Jan 02, 1981, 42 y.o.   MRN: [id number]  HPI Here for health maintenance exam and to review chronic medical problems    Wt Readings from Last 3 Encounters:  08/02/22 148 lb 8 oz (67.4 kg)  07/28/22 148 lb (67.1 kg)  01/10/22 147 lb (66.7 kg)   24.71 kg/m  Has a bad sinus infection - really frustrated  Day 5 of the cefdinir  Some improvement  Went back to work yesterday  Cannot hear - really full of congestion ,  nasal mucous is less green   [employer] for exercise Loves it - gone there for a long time   Vitals:   08/02/22 0830  BP: 104/62  Pulse: (!) 50  Temp: (!) 97.2 F (36.2 C)  SpO2: 100%    Immunization History  Administered Date(s) Administered   Influenza Split 05/01/2011   Influenza,inj,Quad PF,6+ Mos 06/11/2019, 06/15/2020, 07/29/2021   Td 08/01/1999, 01/24/2010   Tdap 07/29/2021   Health Maintenance Due  Topic Date Due   Hepatitis C Screening  Never done   PAP SMEAR-Modifier  07/29/2021   Flu shot -wants ot wait , is getting over a sinus infection (was px cefdinir from UC)  Pap 06/2018 IUD- coming up on year 5 this summer and will get a pap  This is her 3 rd one  No periods   Mammogram : has not had one yet , wants to start Self breast exam: no lumps  Has has breast enhancement surgery   Past h/o malig melanoma  Goes yearly , nothing new  Uses sun protection   Mood is fairly good  [employer] Job is stressful    H/o small fiber neuropathy It is worse in winter but manageable   Hyperlipidemia Lab Results  Component Value Date   CHOL 157 07/28/2022   CHOL 153 07/22/2021   CHOL 172 07/29/2020   Lab Results  Component Value Date   HDL 45 07/28/2022   HDL 48 07/22/2021   HDL 42 07/29/2020   Lab Results  Component Value Date   LDLCALC 94 07/28/2022   LDLCALC 89 07/22/2021   LDLCALC 107 (H) 07/29/2020   Lab Results  Component Value Date   TRIG 100 07/28/2022   TRIG  84 07/22/2021   TRIG 130 07/29/2020   Lab Results  Component Value Date   CHOLHDL 3.5 07/28/2022   CHOLHDL 3.2 07/22/2021   CHOLHDL 4.1 07/29/2020   No results found for: "LDLDIRECT" Crestor 5 mg daily  Tolerating and doing well  Diet is pretty good  Getting in more veg and fruit  Is a snacker     Other labs  Results for orders placed or performed in visit on 07/28/22  CBC with Differential/Platelet  Result Value Ref Range   WBC 10.5 3.4 - 10.8 x10E3/uL   RBC 4.31 3.77 - 5.28 x10E6/uL   Hemoglobin 13.0 11.1 - 15.9 g/dL   Hematocrit 37.8 34.0 - 46.6 %   MCV 88 79 - 97 fL   MCH 30.2 26.6 - 33.0 pg   MCHC 34.4 31.5 - 35.7 g/dL   RDW 12.0 11.7 - 15.4 %   Platelets 275 150 - 450 x10E3/uL   Neutrophils 78 Not Estab. %   Lymphs 15 Not Estab. %   Monocytes 6 Not Estab. %   Eos 1 Not Estab. %   Basos 0 Not Estab. %   Neutrophils Absolute 8.1 (H)  1.4 - 7.0 x10E3/uL   Lymphocytes Absolute 1.6 0.7 - 3.1 x10E3/uL   Monocytes Absolute 0.6 0.1 - 0.9 x10E3/uL   EOS (ABSOLUTE) 0.1 0.0 - 0.4 x10E3/uL   Basophils Absolute 0.0 0.0 - 0.2 x10E3/uL   Immature Granulocytes 0 Not Estab. %   Immature Grans (Abs) 0.0 0.0 - 0.1 x10E3/uL  Comprehensive metabolic panel  Result Value Ref Range   Glucose 102 (H) 70 - 99 mg/dL   BUN 10 6 - 24 mg/dL   Creatinine, Ser 0.67 0.57 - 1.00 mg/dL   eGFR 113 >59 mL/min/1.73   BUN/Creatinine Ratio 15 9 - 23   Sodium 141 134 - 144 mmol/L   Potassium 4.9 3.5 - 5.2 mmol/L   Chloride 104 96 - 106 mmol/L   CO2 25 20 - 29 mmol/L   Calcium 9.1 8.7 - 10.2 mg/dL   Total Protein 6.4 6.0 - 8.5 g/dL   Albumin 4.1 3.9 - 4.9 g/dL   Globulin, Total 2.3 1.5 - 4.5 g/dL   Albumin/Globulin Ratio 1.8 1.2 - 2.2   Bilirubin Total 0.4 0.0 - 1.2 mg/dL   Alkaline Phosphatase 64 44 - 121 IU/L   AST 27 0 - 40 IU/L   ALT 29 0 - 32 IU/L  Lipid panel  Result Value Ref Range   Cholesterol, Total 157 100 - 199 mg/dL   Triglycerides 100 0 - 149 mg/dL   HDL 45 >39 mg/dL    VLDL Cholesterol Cal 18 5 - 40 mg/dL   LDL Chol Calc (NIH) 94 0 - 99 mg/dL   Chol/HDL Ratio 3.5 0.0 - 4.4 ratio  TSH  Result Value Ref Range   TSH 2.180 0.450 - 4.500 uIU/mL     Patient Active Problem List   Diagnosis Date Noted   Encounter for screening mammogram for breast cancer 08/02/2022   Acute sinusitis 08/02/2022   Left lateral abdominal pain 01/10/2022   Routine general medical examination at a health care facility 06/15/2021   Abnormal MRI, cervical spine 02/05/2015   Small fiber neuropathy 11/18/2014   Temperature intolerance 09/28/2014   Hyperlipidemia 09/19/2013   Myofascial pain 05/28/2012   Hematuria, microscopic 10/23/2011   SLEEP DISORDER 04/27/2010   DYSMENORRHEA 02/27/2007   Past Medical History:  Diagnosis Date   Dysplastic nevus 06/15/2008   R scapula mod - severe, excision    Malignant melanoma ([place]) 2009   left arm- wide excision, clarks level 4, breslow's 1.57m   Skin cancer    Past Surgical History:  Procedure Laterality Date   BREAST ENHANCEMENT SURGERY     Social History   Tobacco Use   Smoking status: Never   Smokeless tobacco: Never  Substance Use Topics   Alcohol use: Yes    Alcohol/week: 0.0 standard drinks of alcohol    Comment: Occ   Drug use: No   Family History  Problem Relation Age of Onset   Coronary artery disease Maternal Grandfather    Hypertension Paternal Grandfather    Nephrolithiasis Mother    Allergies  Allergen Reactions   Penicillins     REACTION: As a small child does not remember exact reaction.   Current Outpatient Medications on File Prior to Visit  Medication Sig Dispense Refill   cefdinir (OMNICEF) 300 MG capsule Take 1 capsule (300 mg total) by mouth 2 (two) times daily for 10 days. 20 capsule 0   IUD'S IU by Intrauterine route.     No current facility-administered medications on file prior to visit.  Review of Systems  Constitutional:  Positive for fatigue. Negative for activity change, appetite  change, fever and unexpected weight change.  HENT:  Positive for congestion, postnasal drip, sinus pressure and sinus pain. Negative for ear pain, rhinorrhea and sore throat.   Eyes:  Negative for pain, redness and visual disturbance.  Respiratory:  Negative for cough, shortness of breath and wheezing.   Cardiovascular:  Negative for chest pain and palpitations.  Gastrointestinal:  Negative for abdominal pain, blood in stool, constipation and diarrhea.  Endocrine: Negative for polydipsia and polyuria.  Genitourinary:  Negative for dysuria, frequency and urgency.  Musculoskeletal:  Negative for arthralgias, back pain and myalgias.  Skin:  Negative for pallor and rash.  Allergic/Immunologic: Negative for environmental allergies.  Neurological:  Negative for dizziness, syncope and headaches.  Hematological:  Negative for adenopathy. Does not bruise/bleed easily.  Psychiatric/Behavioral:  Negative for decreased concentration and dysphoric mood. The patient is not nervous/anxious.        Objective:   Physical Exam Constitutional:      General: She is not in acute distress.    Appearance: Normal appearance. She is well-developed and normal weight. She is not ill-appearing or diaphoretic.  HENT:     Head: Normocephalic and atraumatic.     Right Ear: Tympanic membrane, ear canal and external ear normal.     Left Ear: Tympanic membrane, ear canal and external ear normal.     Ears:     Comments: Maxillary sinus tenderness    Nose: Congestion present.     Mouth/Throat:     Mouth: Mucous membranes are moist.     Pharynx: Oropharynx is clear. No posterior oropharyngeal erythema.  Eyes:     General: No scleral icterus.    Extraocular Movements: Extraocular movements intact.     Conjunctiva/sclera: Conjunctivae normal.     Pupils: Pupils are equal, round, and reactive to light.  Neck:     Thyroid: No thyromegaly.     Vascular: No carotid bruit or JVD.  Cardiovascular:     Rate and Rhythm:  Normal rate and regular rhythm.     Pulses: Normal pulses.     Heart sounds: Normal heart sounds.     No gallop.  Pulmonary:     Effort: Pulmonary effort is normal. No respiratory distress.     Breath sounds: Normal breath sounds. No wheezing.     Comments: Good air exch Chest:     Chest wall: No tenderness.  Abdominal:     General: Bowel sounds are normal. There is no distension or abdominal bruit.     Palpations: Abdomen is soft. There is no mass.     Tenderness: There is no abdominal tenderness.     Hernia: No hernia is present.  Genitourinary:    Comments: Breast and pelvic exam done with gyn provider Musculoskeletal:        General: No tenderness. Normal range of motion.     Cervical back: Normal range of motion and neck supple. No rigidity. No muscular tenderness.     Right lower leg: No edema.     Left lower leg: No edema.     Comments: No kyphosis   Lymphadenopathy:     Cervical: No cervical adenopathy.  Skin:    General: Skin is warm and dry.     Coloration: Skin is not pale.     Findings: No erythema or rash.     Comments: Fair  Solar lentigines diffusely   Neurological:  Mental Status: She is alert. Mental status is at baseline.     Cranial Nerves: No cranial nerve deficit.     Motor: No abnormal muscle tone.     Coordination: Coordination normal.     Gait: Gait normal.     Deep Tendon Reflexes: Reflexes are normal and symmetric. Reflexes normal.  Psychiatric:        Mood and Affect: Mood normal.        Cognition and Memory: Cognition and memory normal.           Assessment & Plan:   Problem List Items Addressed This Visit       Respiratory   Acute sinusitis    Rev her prev visit for this - some mild imp with cefdinir Some afrin rebound congestion as well Px prednisone 20 mg daily 5 d to help  Inst to stay off afrin  In future- steroid ns may help Update if not starting to improve in a week or if worsening        Relevant Medications    predniSONE (DELTASONE) 20 MG tablet     Nervous and Auditory   Small fiber neuropathy    Per pt fairly stable      Relevant Medications   busPIRone (BUSPAR) 15 MG tablet     Other   Encounter for screening mammogram for breast cancer    First mammogram ordered Pt will call to schedule      Relevant Orders   MM 3D SCREEN BREAST BILATERAL   Hyperlipidemia    Disc goals for lipids and reasons to control them Rev last labs with pt Rev low sat fat diet in detail  Crestor 5 mg daily  Tolerating well  LDL under 100  Good habits        Relevant Medications   rosuvastatin (CRESTOR) 5 MG tablet   Routine general medical examination at a health care facility - Primary    Reviewed health habits including diet and exercise and skin cancer prevention Reviewed appropriate screening tests for age  Also reviewed health mt list, fam hx and immunization status , as well as social and family history   See HPI Labs reviewed  Flu shot given  Pap will be this summer/up to date  Mammogram ordered, pt will call to schedule  Utd derm care in setting of past melanoma Enc further good diet and exercise

## 2022-08-02 NOTE — Assessment & Plan Note (Signed)
Rev her prev visit for this - some mild imp with cefdinir Some afrin rebound congestion as well Px prednisone 20 mg daily 5 d to help  Inst to stay off afrin  In future- steroid ns may help Update if not starting to improve in a week or if worsening

## 2022-10-03 ENCOUNTER — Ambulatory Visit
Admission: RE | Admit: 2022-10-03 | Discharge: 2022-10-03 | Disposition: A | Discharge status: Home / Self Care | Payer: Managed Care, Other (non HMO) | Source: Ambulatory Visit | Attending: Family Medicine | Admitting: Family Medicine

## 2022-10-03 DIAGNOSIS — Z1231 Encounter for screening mammogram for malignant neoplasm of breast: Secondary | ICD-10-CM

## 2022-10-05 ENCOUNTER — Telehealth: Payer: Self-pay | Admitting: Family Medicine

## 2022-10-05 DIAGNOSIS — Z975 Presence of (intrauterine) contraceptive device: Secondary | ICD-10-CM

## 2022-10-05 NOTE — Addendum Note (Signed)
Addended by: Loura Pardon A on: 10/05/2022 04:25 PM   Modules accepted: Orders

## 2022-10-05 NOTE — Telephone Encounter (Signed)
Patient called in and stated that her OB no longer accepts her [employer]. She was wondering if there was another OB that she could be referred to that can remove and replace her IUD. Please advise! Thank you!

## 2022-10-05 NOTE — Telephone Encounter (Signed)
I put the referral in  Please let us know if you don't hear in 1-2 weeks

## 2022-10-05 NOTE — Telephone Encounter (Signed)
Pt notified referral placed and also referral dpt sent letter she should be able to view in mychart letting her know where referral was sent ([facility]) and she can call them directly to schedule appt

## 2022-10-13 ENCOUNTER — Telehealth: Payer: Self-pay | Admitting: Obstetrics and Gynecology

## 2022-10-13 NOTE — Telephone Encounter (Signed)
This pt has referral from [facility].  I got her scheduled with [facility] on 11/27/2022 for annual and IUD removal and reinsertion.

## 2022-11-26 NOTE — Progress Notes (Unsigned)
PCP:  Judy Pimple, MD   No chief complaint on file.    HPI:      Ms. Caitlin Ward is a 42 y.o. No obstetric history on file. whose LMP was No LMP recorded. (Menstrual status: IUD)., presents today for her  NP annual examination, referred by PCP.  Her menses are {norm/abn:715}, lasting {number: 22536} days.  Dysmenorrhea {dysmen:716}. She {does:18564} have intermenstrual bleeding.  Sex activity: {sex active: 315163}.  Last Pap: 11/02/15 Results were: no abnormalities  Hx of STDs: {STD hx:14358}  Last mammogram: 10/03/22  Results were: normal--routine follow-up in 12 months There is no FH of breast cancer. There is no FH of ovarian cancer. The patient {does:18564} do self-breast exams.  Tobacco use: {tob:20664} Alcohol use: {Alcohol:11675} No drug use.  Exercise: {exercise:31265}  She {does:18564} get adequate calcium and Vitamin D in her diet.  Patient Active Problem List   Diagnosis Date Noted   IUD (intrauterine device) in place 10/05/2022   Encounter for screening mammogram for breast cancer 08/02/2022   Acute sinusitis 08/02/2022   Left lateral abdominal pain 01/10/2022   Routine general medical examination at a health care facility 06/15/2021   Abnormal MRI, cervical spine 02/05/2015   Small fiber neuropathy 11/18/2014   Temperature intolerance 09/28/2014   Hyperlipidemia 09/19/2013   Myofascial pain 05/28/2012   Hematuria, microscopic 10/23/2011   SLEEP DISORDER 04/27/2010   DYSMENORRHEA 02/27/2007    Past Surgical History:  Procedure Laterality Date   BREAST ENHANCEMENT SURGERY      Family History  Problem Relation Age of Onset   Coronary artery disease Maternal Grandfather    Hypertension Paternal Grandfather    Nephrolithiasis Mother     Social History   Socioeconomic History   Marital status: Married    Spouse name: Not on file   Number of children: Not on file   Years of education: Not on file   Highest education level: Not on file   Occupational History   Occupation: administration    Employer: [employer]    Comment: loves it  Tobacco Use   Smoking status: Never   Smokeless tobacco: Never  Substance and Sexual Activity   Alcohol use: Yes    Alcohol/week: 0.0 standard drinks of alcohol    Comment: Occ   Drug use: No   Sexual activity: Yes    Partners: Male  Other Topics Concern   Not on file  Social History Narrative   Exercises regularly   Social Determinants of Health   Financial Resource Strain: Not on file  Food Insecurity: Not on file  Transportation Needs: Not on file  Physical Activity: Not on file  Stress: Not on file  Social Connections: Not on file  Intimate Partner Violence: Not on file     Current Outpatient Medications:    busPIRone (BUSPAR) 15 MG tablet, Take 1 tablet (15 mg total) by mouth 2 (two) times daily as needed. Anxiety, Disp: 180 tablet, Rfl: 3   IUD'S IU, by Intrauterine route., Disp: , Rfl:    predniSONE (DELTASONE) 20 MG tablet, Take 1 tablet (20 mg total) by mouth daily with breakfast., Disp: 10 tablet, Rfl: 0   rosuvastatin (CRESTOR) 5 MG tablet, TAKE 1 TABKET BY MOUTH EVERYDAY, Disp: 90 tablet, Rfl: 3     ROS:  Review of Systems BREAST: No symptoms   Objective: There were no vitals taken for this visit.   OBGyn Exam  Results: No results found for this or any previous visit (  from the past 24 hour(s)).  Assessment/Plan: No diagnosis found.  No orders of the defined types were placed in this encounter.            GYN counsel {counseling: 16159}     F/U  No follow-ups on file.  Rajat Staver B. Jazmine Heckman, PA-C 11/26/2022 9:16 PM

## 2022-11-27 ENCOUNTER — Ambulatory Visit: Payer: Managed Care, Other (non HMO) | Admitting: Obstetrics and Gynecology

## 2022-11-27 ENCOUNTER — Encounter: Payer: Self-pay | Admitting: Obstetrics and Gynecology

## 2022-11-27 VITALS — BP 108/74 | Ht 64.0 in | Wt 149.0 lb

## 2022-11-27 DIAGNOSIS — Z124 Encounter for screening for malignant neoplasm of cervix: Secondary | ICD-10-CM

## 2022-11-27 DIAGNOSIS — Z30433 Encounter for removal and reinsertion of intrauterine contraceptive device: Secondary | ICD-10-CM

## 2022-11-27 DIAGNOSIS — Z01419 Encounter for gynecological examination (general) (routine) without abnormal findings: Secondary | ICD-10-CM | POA: Diagnosis not present

## 2022-11-27 DIAGNOSIS — Z1231 Encounter for screening mammogram for malignant neoplasm of breast: Secondary | ICD-10-CM

## 2022-11-27 DIAGNOSIS — Z1151 Encounter for screening for human papillomavirus (HPV): Secondary | ICD-10-CM

## 2022-11-27 DIAGNOSIS — Z30431 Encounter for routine checking of intrauterine contraceptive device: Secondary | ICD-10-CM

## 2022-11-27 NOTE — Patient Instructions (Signed)
I value your feedback and you entrusting us with your care. If you get a [facility] patient survey, I would appreciate you taking the time to let us know about your experience today. Thank you! ? ? ?

## 2022-12-02 LAB — IGP, APTIMA HPV: HPV Aptima: NEGATIVE

## 2022-12-04 ENCOUNTER — Encounter: Payer: Self-pay | Admitting: Dermatology

## 2022-12-04 ENCOUNTER — Ambulatory Visit (INDEPENDENT_AMBULATORY_CARE_PROVIDER_SITE_OTHER): Payer: Managed Care, Other (non HMO) | Admitting: Dermatology

## 2022-12-04 VITALS — BP 127/67 | HR 62

## 2022-12-04 DIAGNOSIS — D229 Melanocytic nevi, unspecified: Secondary | ICD-10-CM

## 2022-12-04 DIAGNOSIS — Z85820 Personal history of malignant melanoma of skin: Secondary | ICD-10-CM | POA: Diagnosis not present

## 2022-12-04 DIAGNOSIS — L821 Other seborrheic keratosis: Secondary | ICD-10-CM

## 2022-12-04 DIAGNOSIS — Z1283 Encounter for screening for malignant neoplasm of skin: Secondary | ICD-10-CM

## 2022-12-04 DIAGNOSIS — D2372 Other benign neoplasm of skin of left lower limb, including hip: Secondary | ICD-10-CM

## 2022-12-04 DIAGNOSIS — D225 Melanocytic nevi of trunk: Secondary | ICD-10-CM

## 2022-12-04 DIAGNOSIS — L564 Polymorphous light eruption: Secondary | ICD-10-CM | POA: Diagnosis not present

## 2022-12-04 DIAGNOSIS — L578 Other skin changes due to chronic exposure to nonionizing radiation: Secondary | ICD-10-CM

## 2022-12-04 DIAGNOSIS — L814 Other melanin hyperpigmentation: Secondary | ICD-10-CM

## 2022-12-04 DIAGNOSIS — D2272 Melanocytic nevi of left lower limb, including hip: Secondary | ICD-10-CM

## 2022-12-04 DIAGNOSIS — Z86018 Personal history of other benign neoplasm: Secondary | ICD-10-CM

## 2022-12-04 DIAGNOSIS — D2271 Melanocytic nevi of right lower limb, including hip: Secondary | ICD-10-CM

## 2022-12-04 MED ORDER — MOMETASONE FUROATE 0.1 % EX CREA
TOPICAL_CREAM | CUTANEOUS | 1 refills | Status: AC
Start: 2022-12-04 — End: ?

## 2022-12-04 NOTE — Progress Notes (Signed)
Follow-Up Visit   Subjective  Caitlin Ward is a 42 y.o. female who presents for the following: Skin Cancer Screening and Full Body Skin Exam hx of melanoma, hx of dysplastic  The patient presents for Total-Body Skin Exam (TBSE) for skin cancer screening and mole check. The patient has spots, moles and lesions to be evaluated, some may be new or changing and the patient has concerns that these could be cancer.  The following portions of the chart were reviewed this encounter and updated as appropriate: medications, allergies, medical history  Review of Systems:  No other skin or systemic complaints except as noted in HPI or Assessment and Plan.  Objective  Well appearing patient in no apparent distress; mood and affect are within normal limits.  A full examination was performed including scalp, head, eyes, ears, nose, lips, neck, chest, axillae, abdomen, back, buttocks, bilateral upper extremities, bilateral lower extremities, hands, feet, fingers, toes, fingernails, and toenails. All findings within normal limits unless otherwise noted below.   Relevant physical exam findings are noted in the Assessment and Plan.    Assessment & Plan   LENTIGINES, SEBORRHEIC KERATOSES, HEMANGIOMAS - Benign normal skin lesions - Benign-appearing - Call for any changes  MELANOCYTIC NEVI - Tan-brown and/or pink-flesh-colored symmetric macules and papules - Benign appearing on exam today - Observation - Call clinic for new or changing moles - Recommend daily use of broad spectrum spf 30+ sunscreen to sun-exposed areas.  - Nevus 4 x 3 mm medium brown macule at right anterior thigh, darker inferior 2 mm medium dark brown macule at left spinal lower back 3 mm brown macule, lighter center at left lateral thigh  Benign-appearing. Stable compared to previous visit. Observation.  Call clinic for new or changing moles.  Recommend daily use of broad spectrum spf 30+ sunscreen to sun-exposed areas.     DERMATOFIBROMA Left upper calf below knee Exam: 4.0 mm light brown firm papule/nodule with dimpling  Treatment Plan: A dermatofibroma is a benign growth possibly related to trauma, such as an insect bite, cut from shaving, or inflamed acne-type bump.  Treatment options to remove include shave or excision with resulting scar and risk of recurrence.  Since benign-appearing and not bothersome, will observe for now.   Scar with Bx proven benign nevus with recurrence left upper posterior arm Firm flesh papule with central speckled pigmentation within scar 0.6 cm, no change when compared to photo     Benign-appearing.  Stable. Observation.  Call clinic for new or changing moles.  Recommend daily use of broad spectrum spf 30+ sunscreen to sun-exposed areas.     Polymorphic light eruption trunk, extremities   Clear today, Usually only flared at chest in spring with first sun exposure  Chronic condition that recurs. Discussed rash tends to be more frequent in spring and may lessen over summer as skin 'hardens" to UV exposure Recommend daily broad spectrum (UVA/B) sunscreen with zinc and photoprotection with clothing when possible   When flared, start mometasone cream 1-2 times daily as needed for rash. Avoid applying to face, groin, and axilla. Use as directed. Long-term use can cause thinning of the skin.   Topical steroids (such as triamcinolone, fluocinolone, fluocinonide, mometasone, clobetasol, halobetasol, betamethasone, hydrocortisone) can cause thinning and lightening of the skin if they are used for too long in the same area. Your physician has selected the right strength medicine for your problem and area affected on the body. Please use your medication only as directed by your  physician to prevent side effects.    ACTINIC DAMAGE - Chronic condition, secondary to cumulative UV/sun exposure - diffuse scaly erythematous macules with underlying dyspigmentation - Recommend daily  broad spectrum sunscreen SPF 30+ to sun-exposed areas, reapply every 2 hours as needed.  - Staying in the shade or wearing long sleeves, sun glasses (UVA+UVB protection) and wide brim hats (4-inch brim around the entire circumference of the hat) are also recommended for sun protection.  - Call for new or changing lesions.  HISTORY OF DYSPLASTIC NEVUS No evidence of recurrence today at right scalpula 2009 Recommend regular full body skin exams Recommend daily broad spectrum sunscreen SPF 30+ to sun-exposed areas, reapply every 2 hours as needed.  Call if any new or changing lesions are noted between office visits  HISTORY OF MELANOMA - No evidence of recurrence today left arm, WLE 2009, clark S level IV, Breslow's 1.1 mm - No lymphadenopathy - Recommend regular full body skin exams - Recommend daily broad spectrum sunscreen SPF 30+ to sun-exposed areas, reapply every 2 hours as needed.  - Call if any new or changing lesions are noted between office visits   SKIN CANCER SCREENING PERFORMED TODAY.  Polymorphic light eruption  Related Medications mometasone (ELOCON) 0.1 % cream Apply 1-2 times daily as needed for rash. Avoid applying to face, groin, and axilla. Use as directed. Long-term use can cause thinning of the skin.   Return in about 1 year (around 12/04/2023) for TBSE.  I, Asher Muir, CMA, am acting as scribe for Willeen Niece, MD.   Documentation: I have reviewed the above documentation for accuracy and completeness, and I agree with the above.  Willeen Niece, MD

## 2022-12-04 NOTE — Patient Instructions (Addendum)
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 Discussed rash tends to be more frequent in spring and may lessen over summer as skin 'hardens" to UV exposure Recommend daily broad spectrum (UVA/B) sunscreen with zinc and photoprotection with clothing when possible   When flared, start mometasone cream 1-2 times daily as needed for rash. Avoid applying to face, groin, and axilla. Use as directed. Long-term use can cause thinning of the skin.   Melanoma ABCDEs  Melanoma is the most dangerous type of skin cancer, and is the leading cause of death from skin disease.  You are more likely to develop melanoma if you: Have light-colored skin, light-colored eyes, or red or blond hair Spend a lot of time in the sun Tan regularly, either outdoors or in a tanning bed Have had blistering sunburns, especially during childhood Have a close family member who has had a melanoma Have atypical moles or large birthmarks  Early detection of melanoma is key since treatment is typically straightforward and cure rates are extremely high if we catch it early.   The first sign of melanoma is often a change in a mole or a new dark spot.  The ABCDE system is a way of remembering the signs of melanoma.  A for asymmetry:  The two halves do not match. B for border:  The edges of the growth are irregular. C for color:  A mixture of colors are present instead of an even brown color. D for diameter:  Melanomas are usually (but not always) greater than 6mm - the size of a pencil eraser. E for evolution:  The spot keeps changing in size, shape, and color.  Please check your skin once per month between visits. You can use a small mirror in front and a large mirror behind you to keep an eye on the back side or your body.   If you see any new or changing lesions before your next follow-up, please call to schedule a visit.  Please continue daily skin protection including broad spectrum sunscreen SPF 30+ to  sun-exposed areas, reapplying every 2 hours as needed when you're outdoors.   Staying in the shade or wearing long sleeves, sun glasses (UVA+UVB protection) and wide brim hats (4-inch brim around the entire circumference of the hat) are also recommended for sun protection.    Due to recent changes in healthcare laws, you may see results of your pathology and/or laboratory studies on MyChart before the doctors have had a chance to review them. We understand that in some cases there may be results that are confusing or concerning to you. Please understand that not all results are received at the same time and often the doctors may need to interpret multiple results in order to provide you with the best plan of care or course of treatment. Therefore, we ask that you please give [place] 2 business days to thoroughly review all your results before contacting the office for clarification. Should we see a critical lab result, you will be contacted sooner.   If You Need Anything After Your Visit  If you have any questions or concerns for your doctor, please call our main line at [phone] and press option 4 to reach your doctor's medical assistant. If no one answers, please leave a voicemail as directed and we will return your call as soon as possible. Messages left after 4 pm will be answered the following business day.   You may also send [place] a message via MyChart. We typically respond  to MyChart messages within 1-2 business days.  For prescription refills, please ask your pharmacy to contact our office. Our fax number is [phone].  If you have an urgent issue when the clinic is closed that cannot wait until the next business day, you can page your doctor at the number below.    Please note that while we do our best to be available for urgent issues outside of office hours, we are not available 24/7.   If you have an urgent issue and are unable to reach [place], you may choose to seek medical care at your  doctor's office, retail clinic, urgent care center, or emergency room.  If you have a medical emergency, please immediately call 911 or go to the emergency department.  Pager Numbers  - Dr. Gwen Pounds: [phone]  - Dr. Neale Burly: [phone]  - Dr. Roseanne Reno: [phone]  In the event of inclement weather, please call our main line at [phone] for an update on the status of any delays or closures.  Dermatology Medication Tips: Please keep the boxes that topical medications come in in order to help keep track of the instructions about where and how to use these. Pharmacies typically print the medication instructions only on the boxes and not directly on the medication tubes.   If your medication is too expensive, please contact our office at [phone] option 4 or send [place] a message through MyChart.   We are unable to tell what your co-pay for medications will be in advance as this is different depending on your insurance coverage. However, we may be able to find a substitute medication at lower cost or fill out paperwork to get insurance to cover a needed medication.   If a prior authorization is required to get your medication covered by your insurance company, please allow [place] 1-2 business days to complete this process.  Drug prices often vary depending on where the prescription is filled and some pharmacies may offer cheaper prices.  The website www.goodrx.com contains coupons for medications through different pharmacies. The prices here do not account for what the cost may be with help from insurance (it may be cheaper with your insurance), but the website can give you the price if you did not use any insurance.  - You can print the associated coupon and take it with your prescription to the pharmacy.  - You may also stop by our office during regular business hours and pick up a GoodRx coupon card.  - If you need your prescription sent electronically to a different pharmacy, notify  our office through [facility] or by phone at [phone] option 4.     Si Usted Necesita Algo Despus de Su Visita  Tambin puede enviarnos un mensaje a travs de Clinical cytogeneticist. Por lo general respondemos a los mensajes de MyChart en el transcurso de 1 a 2 das hbiles.  Para renovar recetas, por favor pida a su farmacia que se ponga en contacto con nuestra oficina. Annie Sable de fax es [place] [phone].  Si tiene un asunto urgente cuando la clnica est cerrada y que no puede esperar hasta el siguiente da hbil, puede llamar/localizar a su doctor(a) al nmero que aparece a continuacin.   Por favor, tenga en cuenta que aunque hacemos todo lo posible para estar disponibles para asuntos urgentes fuera del horario de [place], no estamos disponibles las 24 horas del da, los 7 [address]  [address] de la [place].   Si tiene un problema urgente y no puede comunicarse con nosotros,  puede optar por buscar atencin mdica  en el consultorio de su doctor(a), en una clnica privada, en un centro de atencin urgente o en una sala de emergencias.  Si tiene Engineering geologist, por favor llame inmediatamente al 911 o vaya a la sala de emergencias.  Nmeros de bper  - Dr. Nehemiah Massed: [phone]  - Dra. Moye: [phone]  - Dra. Nicole Kindred: [phone]  En caso de inclemencias del [place], por favor llame a Johnsie Kindred principal al [phone] para una actualizacin sobre el [place] de cualquier retraso o cierre.  Consejos para la medicacin en dermatologa: Por favor, guarde las cajas en las que vienen los medicamentos de uso tpico para ayudarle a seguir las instrucciones sobre dnde y cmo usarlos. Las farmacias generalmente imprimen las instrucciones del medicamento slo en las cajas y no directamente en los tubos del [place].   Si su medicamento es muy caro, por favor, pngase en contacto con Zigmund Daniel llamando al [phone] y presione la opcin 4 o envenos un mensaje a travs de  Pharmacist, community.   No podemos decirle cul ser su copago por los medicamentos por adelantado ya que esto es diferente dependiendo de la cobertura de su seguro. Sin embargo, es posible que podamos encontrar un medicamento sustituto a Electrical engineer un formulario para que el seguro cubra el medicamento que se considera necesario.   Si se requiere una autorizacin previa para que su compaa de seguros [place] su medicamento, por favor permtanos de 1 a 2 das hbiles para completar este proceso.  Los precios de los medicamentos varan con frecuencia dependiendo del Environmental consultant de dnde se surte la receta y alguna farmacias pueden ofrecer precios ms baratos.  El sitio web www.goodrx.com tiene cupones para medicamentos de Airline pilot. Los precios aqu no tienen en cuenta lo que podra costar con la ayuda del seguro (puede ser ms barato con su seguro), pero el sitio web puede darle el precio si no utiliz Research scientist (physical sciences).  - Puede imprimir el cupn correspondiente y llevarlo con su receta a la farmacia.  - Tambin puede pasar por nuestra oficina durante el horario de atencin regular y Charity fundraiser una tarjeta de cupones de GoodRx.  - Si necesita que su receta se enve electrnicamente a una farmacia diferente, informe a nuestra oficina a travs de MyChart de [place] o por telfono llamando al [phone] y presione la opcin 4.

## 2023-05-21 ENCOUNTER — Ambulatory Visit
Admission: EM | Admit: 2023-05-21 | Discharge: 2023-05-21 | Disposition: A | Discharge status: Home / Self Care | Payer: Managed Care, Other (non HMO) | Attending: Emergency Medicine | Admitting: Emergency Medicine

## 2023-05-21 DIAGNOSIS — J069 Acute upper respiratory infection, unspecified: Secondary | ICD-10-CM

## 2023-05-21 MED ORDER — BENZONATATE 100 MG PO CAPS: 100.0000 mg | ORAL_CAPSULE | Freq: Three times a day (TID) | ORAL | 0 refills | Status: DC

## 2023-05-21 MED ORDER — AZITHROMYCIN 250 MG PO TABS: 250.0000 mg | ORAL_TABLET | Freq: Every day | ORAL | 0 refills | Status: DC

## 2023-05-21 NOTE — Discharge Instructions (Addendum)
Your symptoms today are most likely being caused by a virus and should steadily improve in time it can take up to 7 to 10 days before you truly start to see a turnaround however things will get better, if no improvement seen in your symptoms by Friday you may begin use of azithromycin which will provide coverage for bacterial  In the meantime you may use Tessalon every 8 hours as needed for coughing, may also use over-the-counter Delsym in addition to this    May take 600 to 800 mg of ibuprofen every 6-8 hours for management of fevers and bodyaches   For cough: honey 1/2 to 1 teaspoon (you can dilute the honey in water or another fluid).  You can also use guaifenesin ( Mucinex)  and dextromethorphan for cough. You can use a humidifier for chest congestion and cough.  If you don't have a humidifier, you can sit in the bathroom with the hot shower running.      For sore throat: try warm salt water gargles, cepacol lozenges, throat spray, warm tea or water with lemon/honey, popsicles or ice, or OTC cold relief medicine for throat discomfort.   For congestion: take a daily anti-histamine like Zyrtec, Claritin, and a oral decongestant, such as pseudoephedrine.  You can also use Flonase 1-2 sprays in each nostril daily.   It is important to stay hydrated: drink plenty of fluids (water, gatorade/powerade/pedialyte, juices, or teas) to keep your throat moisturized and help further relieve irritation/discomfort.

## 2023-05-21 NOTE — ED Triage Notes (Signed)
Patient presents to UC for cough, nasal congestion, chest congestion since Thursday. Fever on Saturday. Treating symptoms with ibuprofen.

## 2023-05-21 NOTE — ED Provider Notes (Signed)
Caitlin Ward    CSN: [id number] Arrival date & time: 05/21/23  0856      History   Chief Complaint Chief Complaint  Patient presents with   Nasal Congestion   Cough   Fever    HPI Caitlin Ward is a 42 y.o. female.   Patient presents for evaluation of fever, nasal congestion, rhinorrhea, and a nonproductive cough and intermittent headaches present for 4 to 5 days.  Fever has resolved, last occurrence 2 to 3 days ago.  Feels as if she is experiencing chest congestion that she is unable to move, centralized.  Headache occurring intermittently, present behind the eyes.  Known sick contact prior.  Tolerating food and liquids.  Has attempted use of ibuprofen.  Denies respiratory history, non-smoker.  Denies presence of shortness of breath or wheezing.  Past Medical History:  Diagnosis Date   Dysplastic nevus 06/15/2008   R scapula mod - severe, excision    Malignant melanoma (HCC) 2009   left arm- wide excision, clarks level 4, breslow's 1.62mm   Skin cancer     Patient Active Problem List   Diagnosis Date Noted   IUD (intrauterine device) in place 10/05/2022   Encounter for screening mammogram for breast cancer 08/02/2022   Acute sinusitis 08/02/2022   Left lateral abdominal pain 01/10/2022   Routine general medical examination at a health care facility 06/15/2021   Abnormal MRI, cervical spine 02/05/2015   Small fiber neuropathy 11/18/2014   Temperature intolerance 09/28/2014   Hyperlipidemia 09/19/2013   Myofascial pain 05/28/2012   Hematuria, microscopic 10/23/2011   SLEEP DISORDER 04/27/2010   DYSMENORRHEA 02/27/2007    Past Surgical History:  Procedure Laterality Date   BREAST ENHANCEMENT SURGERY      OB History     Gravida  2   Para  2   Term  2   Preterm      AB      Living  2      SAB      IAB      Ectopic      Multiple      Live Births  2            Home Medications    Prior to Admission medications    Medication Sig Start Date End Date Taking? Authorizing Provider  azithromycin (ZITHROMAX) 250 MG tablet Take 1 tablet (250 mg total) by mouth daily. Take first 2 tablets together, then 1 every day until finished. 05/25/23  Yes Windell Musson R, NP  benzonatate (TESSALON) 100 MG capsule Take 1 capsule (100 mg total) by mouth every 8 (eight) hours. 05/21/23  Yes Alexi Geibel R, NP  busPIRone (BUSPAR) 15 MG tablet Take 1 tablet (15 mg total) by mouth 2 (two) times daily as needed. Anxiety 08/02/22   Tower, Audrie Gallus, MD  levonorgestrel (MIRENA) 20 MCG/DAY IUD 1 each by Intrauterine route once. 01/14/19   [provider]  mometasone (ELOCON) 0.1 % cream Apply 1-2 times daily as needed for rash. Avoid applying to face, groin, and axilla. Use as directed. Long-term use can cause thinning of the skin. 12/04/22   Willeen Niece, MD  rosuvastatin (CRESTOR) 5 MG tablet TAKE 1 TABKET BY MOUTH EVERYDAY 08/02/22   Tower, Audrie Gallus, MD    Family History Family History  Problem Relation Age of Onset   Nephrolithiasis Mother    Coronary artery disease Maternal Grandfather    Hypertension Paternal Grandfather     Social History Social  History   Tobacco Use   Smoking status: Never   Smokeless tobacco: Never  Vaping Use   Vaping status: Never Used  Substance Use Topics   Alcohol use: Yes    Alcohol/week: 0.0 standard drinks of alcohol    Comment: Occ   Drug use: No     Allergies   Penicillins   Review of Systems Review of Systems   Physical Exam Triage Vital Signs ED Triage Vitals  Encounter Vitals Group     BP 05/21/23 0913 (!) 127/91     Systolic BP Percentile --      Diastolic BP Percentile --      Pulse Rate 05/21/23 0913 96     Resp 05/21/23 0913 18     Temp 05/21/23 0913 99.1 F (37.3 C)     Temp Source 05/21/23 0913 Oral     SpO2 05/21/23 0913 100 %     Weight --      Height --      Head Circumference --      Peak Flow --      Pain Score 05/21/23 0926 0     Pain Loc  --      Pain Education --      Exclude from Growth Chart --    No data found.  Updated Vital Signs BP (!) 127/91 (BP Location: Left Arm)   Pulse 96   Temp 99.1 F (37.3 C) (Oral)   Resp 18   SpO2 100%   Visual Acuity Right Eye Distance:   Left Eye Distance:   Bilateral Distance:    Right Eye Near:   Left Eye Near:    Bilateral Near:     Physical Exam Constitutional:      Appearance: She is ill-appearing.  HENT:     Head: Normocephalic.     Right Ear: Tympanic membrane, ear canal and external ear normal.     Left Ear: Tympanic membrane, ear canal and external ear normal.     Nose: Congestion present. No rhinorrhea.     Mouth/Throat:     Pharynx: Posterior oropharyngeal erythema present. No oropharyngeal exudate.  Eyes:     Extraocular Movements: Extraocular movements intact.  Cardiovascular:     Rate and Rhythm: Normal rate and regular rhythm.     Pulses: Normal pulses.     Heart sounds: Normal heart sounds.  Pulmonary:     Effort: Pulmonary effort is normal.     Breath sounds: Normal breath sounds.  Musculoskeletal:        General: Normal range of motion.     Cervical back: Normal range of motion.  Skin:    General: Skin is warm and dry.  Neurological:     Mental Status: She is alert and oriented to person, place, and time. Mental status is at baseline.      UC Treatments / Results  Labs (all labs ordered are listed, but only abnormal results are displayed) Labs Reviewed - No data to display  EKG   Radiology No results found.  Procedures Procedures (including critical care time)  Medications Ordered in UC Medications - No data to display  Initial Impression / Assessment and Plan / UC Course  I have reviewed the triage vital signs and the nursing notes.  Pertinent labs & imaging results that were available during my care of the patient were reviewed by me and considered in my medical decision making (see chart for details).  Viral URI with  cough  Patient is in no signs of distress nor toxic appearing.  Vital signs are stable.  Low suspicion for pneumonia, pneumothorax or bronchitis and therefore will defer imaging.  Viral testing deferred due to timeline of illness.  Prescribed Tessalon and recommended Mucinex and pseudoephedrine.  Watch wait antibiotic placed at pharmacy for day 10 of illness if no improvement seen, azithromycin prescribed.May use additional over-the-counter medications as needed for supportive care.  May follow-up with urgent care as needed if symptoms persist or worsen.  Final Clinical Impressions(s) / UC Diagnoses   Final diagnoses:  Viral URI with cough     Discharge Instructions      Your symptoms today are most likely being caused by a virus and should steadily improve in time it can take up to 7 to 10 days before you truly start to see a turnaround however things will get better, if no improvement seen in your symptoms by Friday you may begin use of azithromycin which will provide coverage for bacterial  In the meantime you may use Tessalon every 8 hours as needed for coughing, may also use over-the-counter Delsym in addition to this    May take 600 to 800 mg of ibuprofen every 6-8 hours for management of fevers and bodyaches   For cough: honey 1/2 to 1 teaspoon (you can dilute the honey in water or another fluid).  You can also use guaifenesin ( Mucinex)  and dextromethorphan for cough. You can use a humidifier for chest congestion and cough.  If you don't have a humidifier, you can sit in the bathroom with the hot shower running.      For sore throat: try warm salt water gargles, cepacol lozenges, throat spray, warm tea or water with lemon/honey, popsicles or ice, or OTC cold relief medicine for throat discomfort.   For congestion: take a daily anti-histamine like Zyrtec, Claritin, and a oral decongestant, such as pseudoephedrine.  You can also use Flonase 1-2 sprays in each nostril daily.   It is  important to stay hydrated: drink plenty of fluids (water, gatorade/powerade/pedialyte, juices, or teas) to keep your throat moisturized and help further relieve irritation/discomfort.    ED Prescriptions     Medication Sig Dispense Auth. Provider   benzonatate (TESSALON) 100 MG capsule Take 1 capsule (100 mg total) by mouth every 8 (eight) hours. 21 capsule Shadara Lopez R, NP   azithromycin (ZITHROMAX) 250 MG tablet Take 1 tablet (250 mg total) by mouth daily. Take first 2 tablets together, then 1 every day until finished. 6 tablet Valinda Hoar, NP      PDMP not reviewed this encounter.   Valinda Hoar, [place] 05/21/23 [phone]

## 2023-08-09 ENCOUNTER — Telehealth: Payer: Self-pay | Admitting: Family Medicine

## 2023-08-09 DIAGNOSIS — E7800 Pure hypercholesterolemia, unspecified: Secondary | ICD-10-CM

## 2023-08-09 DIAGNOSIS — Z0000 Encounter for general adult medical examination without abnormal findings: Secondary | ICD-10-CM

## 2023-08-09 NOTE — Telephone Encounter (Signed)
-----   Message from Alvina Chou sent at 07/26/2023  3:09 PM EST ----- Regarding: Lab order for Fri, 1.10.25 Patient is scheduled for CPX labs, please order future labs, Thanks , Camelia Eng

## 2023-08-10 ENCOUNTER — Other Ambulatory Visit (INDEPENDENT_AMBULATORY_CARE_PROVIDER_SITE_OTHER): Payer: Managed Care, Other (non HMO)

## 2023-08-10 DIAGNOSIS — Z0000 Encounter for general adult medical examination without abnormal findings: Secondary | ICD-10-CM

## 2023-08-10 DIAGNOSIS — E7800 Pure hypercholesterolemia, unspecified: Secondary | ICD-10-CM

## 2023-08-10 NOTE — Addendum Note (Signed)
 Addended by: Alvina Chou on: 08/10/2023 08:01 AM   Modules accepted: Orders

## 2023-08-11 LAB — TSH: TSH: 2.22 u[IU]/mL (ref 0.450–4.500)

## 2023-08-11 LAB — LIPID PANEL
Chol/HDL Ratio: 3.8 {ratio} (ref 0.0–4.4)
Cholesterol, Total: 178 mg/dL (ref 100–199)
HDL: 47 mg/dL (ref >39)
LDL Chol Calc (NIH): 110 mg/dL — ABNORMAL HIGH (ref 0–99)
Triglycerides: 115 mg/dL (ref 0–149)
VLDL Cholesterol Cal: 21 mg/dL (ref 5–40)

## 2023-08-11 LAB — CBC WITH DIFFERENTIAL/PLATELET
Basophils Absolute: 0 10*3/uL (ref 0.0–0.2)
Basos: 0 %
EOS (ABSOLUTE): 0.2 10*3/uL (ref 0.0–0.4)
Eos: 3 %
Hematocrit: 40.2 % (ref 34.0–46.6)
Hemoglobin: 13.4 g/dL (ref 11.1–15.9)
Immature Grans (Abs): 0 10*3/uL (ref 0.0–0.1)
Immature Granulocytes: 0 %
Lymphocytes Absolute: 1.9 10*3/uL (ref 0.7–3.1)
Lymphs: 28 %
MCH: 30.7 pg (ref 26.6–33.0)
MCHC: 33.3 g/dL (ref 31.5–35.7)
MCV: 92 fL (ref 79–97)
Monocytes Absolute: 0.6 10*3/uL (ref 0.1–0.9)
Monocytes: 8 %
Neutrophils Absolute: 4.1 10*3/uL (ref 1.4–7.0)
Neutrophils: 61 %
Platelets: 384 10*3/uL (ref 150–450)
RBC: 4.37 x10E6/uL (ref 3.77–5.28)
RDW: 12.5 % (ref 11.7–15.4)
WBC: 6.8 10*3/uL (ref 3.4–10.8)

## 2023-08-11 LAB — COMPREHENSIVE METABOLIC PANEL
ALT: 14 [IU]/L (ref 0–32)
AST: 18 [IU]/L (ref 0–40)
Albumin: 4.1 g/dL (ref 3.9–4.9)
Alkaline Phosphatase: 44 [IU]/L (ref 44–121)
BUN/Creatinine Ratio: 19 (ref 9–23)
BUN: 12 mg/dL (ref 6–24)
Bilirubin Total: 0.5 mg/dL (ref 0.0–1.2)
CO2: 23 mmol/L (ref 20–29)
Calcium: 9.1 mg/dL (ref 8.7–10.2)
Chloride: 105 mmol/L (ref 96–106)
Creatinine, Ser: 0.63 mg/dL (ref 0.57–1.00)
Globulin, Total: 2.1 g/dL (ref 1.5–4.5)
Glucose: 100 mg/dL — ABNORMAL HIGH (ref 70–99)
Potassium: 4.8 mmol/L (ref 3.5–5.2)
Sodium: 140 mmol/L (ref 134–144)
Total Protein: 6.2 g/dL (ref 6.0–8.5)
eGFR: 114 mL/min/{1.73_m2} (ref >59)

## 2023-08-17 ENCOUNTER — Ambulatory Visit (INDEPENDENT_AMBULATORY_CARE_PROVIDER_SITE_OTHER): Payer: Managed Care, Other (non HMO) | Admitting: Family Medicine

## 2023-08-17 ENCOUNTER — Encounter: Payer: Self-pay | Admitting: Family Medicine

## 2023-08-17 VITALS — BP 122/78 | HR 67 | Temp 98.1°F | Ht 64.5 in | Wt 148.5 lb

## 2023-08-17 DIAGNOSIS — Z0000 Encounter for general adult medical examination without abnormal findings: Secondary | ICD-10-CM | POA: Diagnosis not present

## 2023-08-17 DIAGNOSIS — E7800 Pure hypercholesterolemia, unspecified: Secondary | ICD-10-CM

## 2023-08-17 DIAGNOSIS — Z23 Encounter for immunization: Secondary | ICD-10-CM

## 2023-08-17 DIAGNOSIS — Z85820 Personal history of malignant melanoma of skin: Secondary | ICD-10-CM

## 2023-08-17 DIAGNOSIS — Z1231 Encounter for screening mammogram for malignant neoplasm of breast: Secondary | ICD-10-CM | POA: Diagnosis not present

## 2023-08-17 NOTE — Patient Instructions (Addendum)
Keep up the exercise   Take care of yourself   Continue dermatology and gynecology visits   Get your mammogram in march   More fruits and veggies and water   The following are examples of protein in diet  Meat  Fish  Eggs  Dairy products  Soy products  Oat milk  Almond milk Nuts and nut butters  Dried beans   Flu shot today

## 2023-08-17 NOTE — Progress Notes (Unsigned)
Subjective:    Patient ID: Caitlin Ward, female    DOB: Dec 05, 1980, 43 y.o.   MRN: [id number]  HPI  Here for health maintenance exam and to review chronic medical problems   Wt Readings from Last 3 Encounters:  08/17/23 148 lb 8 oz (67.4 kg)  11/27/22 149 lb (67.6 kg)  08/02/22 148 lb 8 oz (67.4 kg)   25.10 kg/m  Vitals:   08/17/23 1521  BP: 122/78  Pulse: 67  Temp: 98.1 F (36.7 C)  SpO2: 99%    Immunization History  Administered Date(s) Administered   Influenza Split 05/01/2011   Influenza,inj,Quad PF,6+ Mos 06/11/2019, 06/15/2020, 07/29/2021   Td 08/01/1999, 01/24/2010   Tdap 07/29/2021    Health Maintenance Due  Topic Date Due   INFLUENZA VACCINE  03/01/2023   Working a lot  Busy time of the year    Flu shot -today    [facility]  Self breast exam : no lumps   Gyn health sees gyn  Pap 10/2022 normal with neg HPV  Has mirena IUD  More spotting in past few months  Crampy and hormonal recently    Colon cancer screening - no family history   Bone health   Falls- none  Fractures-none  Supplements - takes vitamin d daily  Last vitamin D Lab Results  Component Value Date   VD25OH 32.4 05/28/2012    Exercise - goes to the gym 3 d per weeks  Mostly weight training   Dermatology care  Saw Dr Roseanne Reno in May  History of melanoma in past  Is very good about sun protectoin    Mood    08/17/2023    3:24 PM 08/02/2022    9:32 AM 07/29/2021   11:28 AM 06/15/2020   10:40 AM 06/11/2019    4:19 PM  Depression screen PHQ 2/9  Decreased Interest 0 0 0 0 0  Down, Depressed, Hopeless 0 0 0 0 0  PHQ - 2 Score 0 0 0 0 0  Altered sleeping 0 0 0 0 0  Tired, decreased energy 0 0 0 0 0  Change in appetite 0 0 0 0 0  Feeling bad or failure about yourself  0 0 0 0 0  Trouble concentrating 0 0 0 0 0  Moving slowly or fidgety/restless 0 0 0 0 0  Suicidal thoughts 0 0 0 0 0  PHQ-9 Score 0 0 0 0 0  Difficult doing  work/chores Not difficult at all Not difficult at all Not difficult at all Not difficult at all Not difficult at all      08/17/2023    3:24 PM  GAD 7 : Generalized Anxiety Score  Nervous, Anxious, on Edge 1  Control/stop worrying 0  Worry too much - different things 0  Trouble relaxing 0  Restless 0  Easily annoyed or irritable 1  Afraid - awful might happen 0  Total GAD 7 Score 2  Anxiety Difficulty Not difficult at all   More stress the last few months / will calm down in the spring  Anxiety  Buspar 15 mg bid prn    Small fiber neuropathy Worse in winter Is manageble    Hyperlipidemia Lab Results  Component Value Date   CHOL 178 08/10/2023   CHOL 157 07/28/2022   CHOL 153 07/22/2021   Lab Results  Component Value Date   HDL 47 08/10/2023   HDL 45 07/28/2022   HDL 48 07/22/2021  Lab Results  Component Value Date   LDLCALC 110 (H) 08/10/2023   LDLCALC 94 07/28/2022   LDLCALC 89 07/22/2021   Lab Results  Component Value Date   TRIG 115 08/10/2023   TRIG 100 07/28/2022   TRIG 84 07/22/2021   Lab Results  Component Value Date   CHOLHDL 3.8 08/10/2023   CHOLHDL 3.5 07/28/2022   CHOLHDL 3.2 07/22/2021   No results found for: "LDLDIRECT" Crestor 5 mg daily  LDL up a little with holday eating     Other labs Lab Results  Component Value Date   NA 140 08/10/2023   K 4.8 08/10/2023   CO2 23 08/10/2023   GLUCOSE 100 (H) 08/10/2023   BUN 12 08/10/2023   CREATININE 0.63 08/10/2023   CALCIUM 9.1 08/10/2023   GFR 111.18 07/22/2012   EGFR 114 08/10/2023   GFRNONAA 100 06/11/2020   Lab Results  Component Value Date   ALT 14 08/10/2023   AST 18 08/10/2023   ALKPHOS 44 08/10/2023   BILITOT 0.5 08/10/2023   Lab Results  Component Value Date   WBC 6.8 08/10/2023   HGB 13.4 08/10/2023   HCT 40.2 08/10/2023   MCV 92 08/10/2023   PLT 384 08/10/2023   Lab Results  Component Value Date   TSH 2.220 08/10/2023    Eats healthy when not holiday time       Patient Active Problem List   Diagnosis Date Noted   History of melanoma 08/17/2023   IUD (intrauterine device) in place 10/05/2022   Encounter for screening mammogram for breast cancer 08/02/2022   Routine general medical examination at a health care facility 06/15/2021   Abnormal MRI, cervical spine 02/05/2015   Small fiber neuropathy 11/18/2014   Temperature intolerance 09/28/2014   Hyperlipidemia 09/19/2013   Myofascial pain 05/28/2012   Hematuria, microscopic 10/23/2011   SLEEP DISORDER 04/27/2010   DYSMENORRHEA 02/27/2007   Past Medical History:  Diagnosis Date   Dysplastic nevus 06/15/2008   R scapula mod - severe, excision    Malignant melanoma (HCC) 2009   left arm- wide excision, clarks level 4, breslow's 1.56mm   Skin cancer    Past Surgical History:  Procedure Laterality Date   BREAST ENHANCEMENT SURGERY     Social History   Tobacco Use   Smoking status: Never   Smokeless tobacco: Never  Vaping Use   Vaping status: Never Used  Substance Use Topics   Alcohol use: Yes    Alcohol/week: 0.0 standard drinks of alcohol    Comment: Occ   Drug use: No   Family History  Problem Relation Age of Onset   Nephrolithiasis Mother    Coronary artery disease Maternal Grandfather    Hypertension Paternal Grandfather    Allergies  Allergen Reactions   Penicillins     REACTION: As a small child does not remember exact reaction.   Current Outpatient Medications on File Prior to Visit  Medication Sig Dispense Refill   busPIRone (BUSPAR) 15 MG tablet Take 1 tablet (15 mg total) by mouth 2 (two) times daily as needed. Anxiety 180 tablet 3   levonorgestrel (MIRENA) 20 MCG/DAY IUD 1 each by Intrauterine route once.     mometasone (ELOCON) 0.1 % cream Apply 1-2 times daily as needed for rash. Avoid applying to face, groin, and axilla. Use as directed. Long-term use can cause thinning of the skin. 45 g 1   rosuvastatin (CRESTOR) 5 MG tablet TAKE 1 TABKET BY MOUTH  EVERYDAY 90 tablet 3  No current facility-administered medications on file prior to visit.    Review of Systems     Objective:   Physical Exam        Assessment & Plan:   Problem List Items Addressed This Visit       Other   Routine general medical examination at a health care facility - Primary   Hyperlipidemia   History of melanoma

## 2023-08-19 NOTE — Assessment & Plan Note (Signed)
Disc goals for lipids and reasons to control them Rev last labs with pt Rev low sat fat diet in detail  Taking crestor 5 mg daily  LDL up a bit from holiday eating Is getting back on track now

## 2023-08-19 NOTE — Assessment & Plan Note (Signed)
Pt is due in march-will call to schedule

## 2023-08-19 NOTE — Assessment & Plan Note (Signed)
Reviewed health habits including diet and exercise and skin cancer prevention Reviewed appropriate screening tests for age  Also reviewed health mt list, fam hx and immunization status , as well as social and family history   See HPI Labs reviewed and ordered Flu shot given  Mammogram due 09/2023 Discussed fall prevention, supplements and exercise for bone density  Utd derm care PHQ 0 despite work stress Encouraged self care Commended exercise  Health Maintenance  Topic Date Due   Hepatitis C Screening  08/16/2024*   COVID-19 Vaccine (1) 09/01/2024*   Mammogram  10/03/2023   Pap with HPV screening  11/27/2027   DTaP/Tdap/Td vaccine (4 - Td or Tdap) 07/30/2031   Flu Shot  Completed   HIV Screening  Completed   HPV Vaccine  Aged Out  *Topic was postponed. The date shown is not the original due date.

## 2023-08-19 NOTE — Assessment & Plan Note (Signed)
Utd derm care with Dr Roseanne Reno Uses sun protection

## 2023-09-13 ENCOUNTER — Other Ambulatory Visit: Payer: Self-pay | Admitting: Family Medicine

## 2023-09-24 ENCOUNTER — Other Ambulatory Visit: Payer: Self-pay | Admitting: Family Medicine

## 2023-09-24 DIAGNOSIS — Z1231 Encounter for screening mammogram for malignant neoplasm of breast: Secondary | ICD-10-CM

## 2023-10-17 ENCOUNTER — Ambulatory Visit
Admission: RE | Admit: 2023-10-17 | Discharge: 2023-10-17 | Disposition: A | Discharge status: Home / Self Care | Payer: Managed Care, Other (non HMO) | Source: Ambulatory Visit | Attending: Family Medicine | Admitting: Family Medicine

## 2023-10-17 DIAGNOSIS — Z1231 Encounter for screening mammogram for malignant neoplasm of breast: Secondary | ICD-10-CM

## 2023-10-18 ENCOUNTER — Encounter: Payer: Self-pay | Admitting: Family Medicine

## 2023-11-06 IMAGING — DX DG ABDOMEN 2V
3 series · 3 of 3 positions shown · non-contrast
Comparison: CT abdomen pelvis December 26, 2011

CLINICAL DATA: Lower abdominal after eating.

EXAM:
ABDOMEN - 2 VIEW

[abdomen supine ap (1 of 2)]
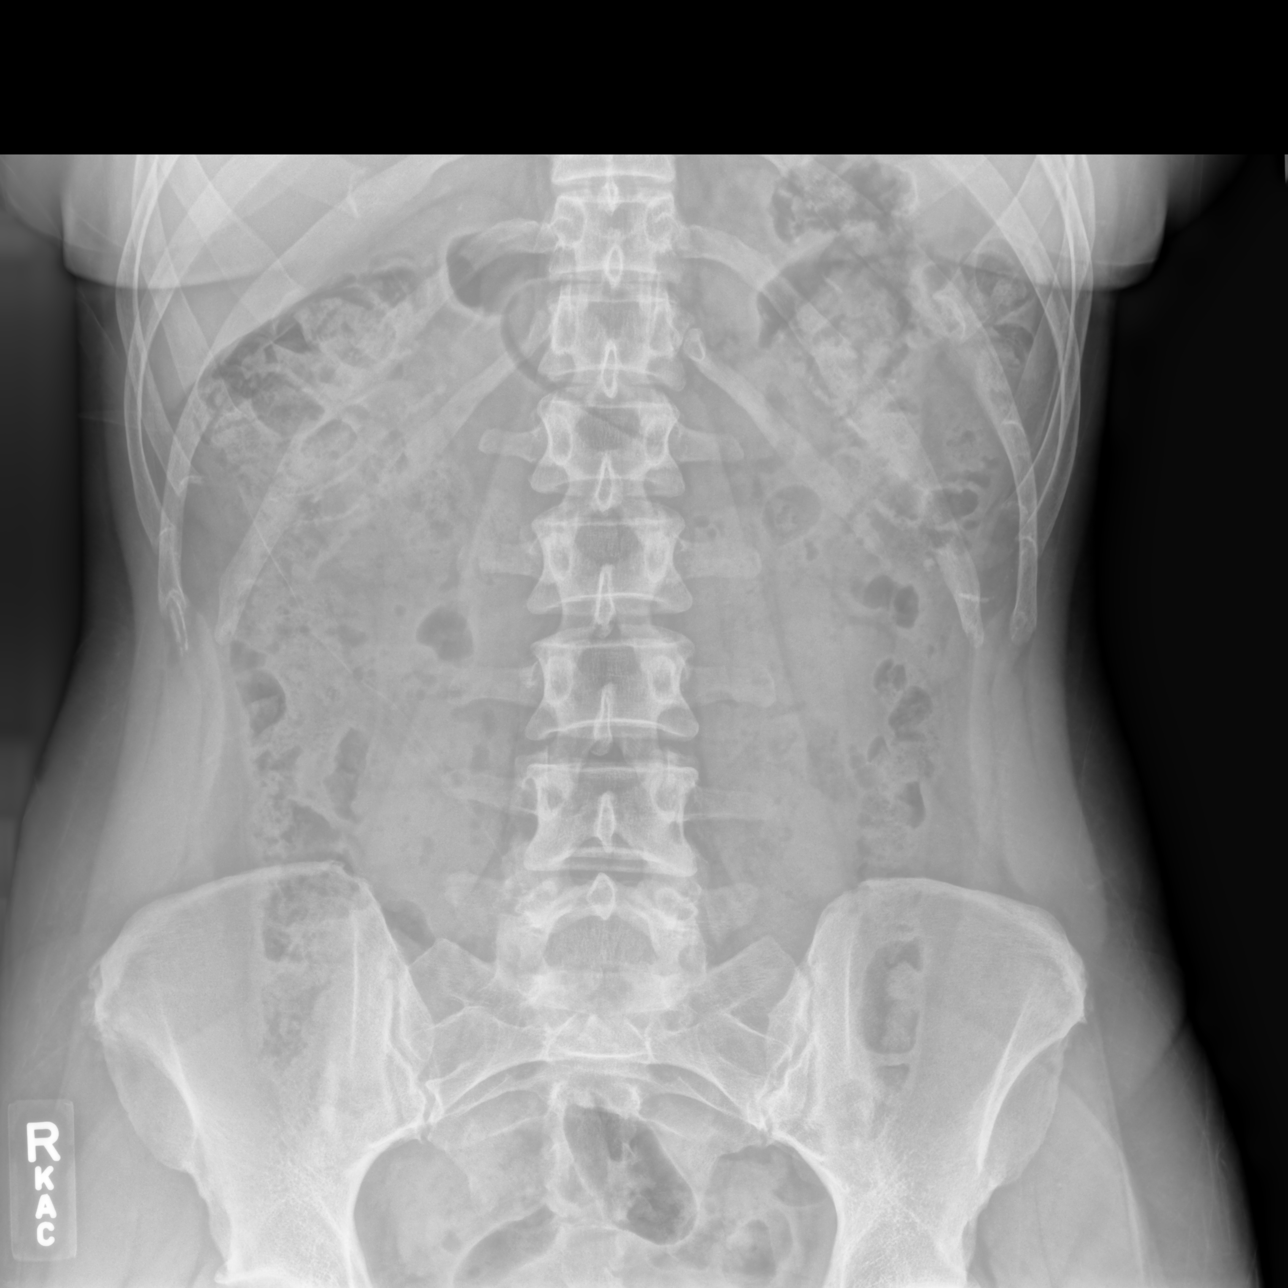

[abdomen supine ap (2 of 2)]
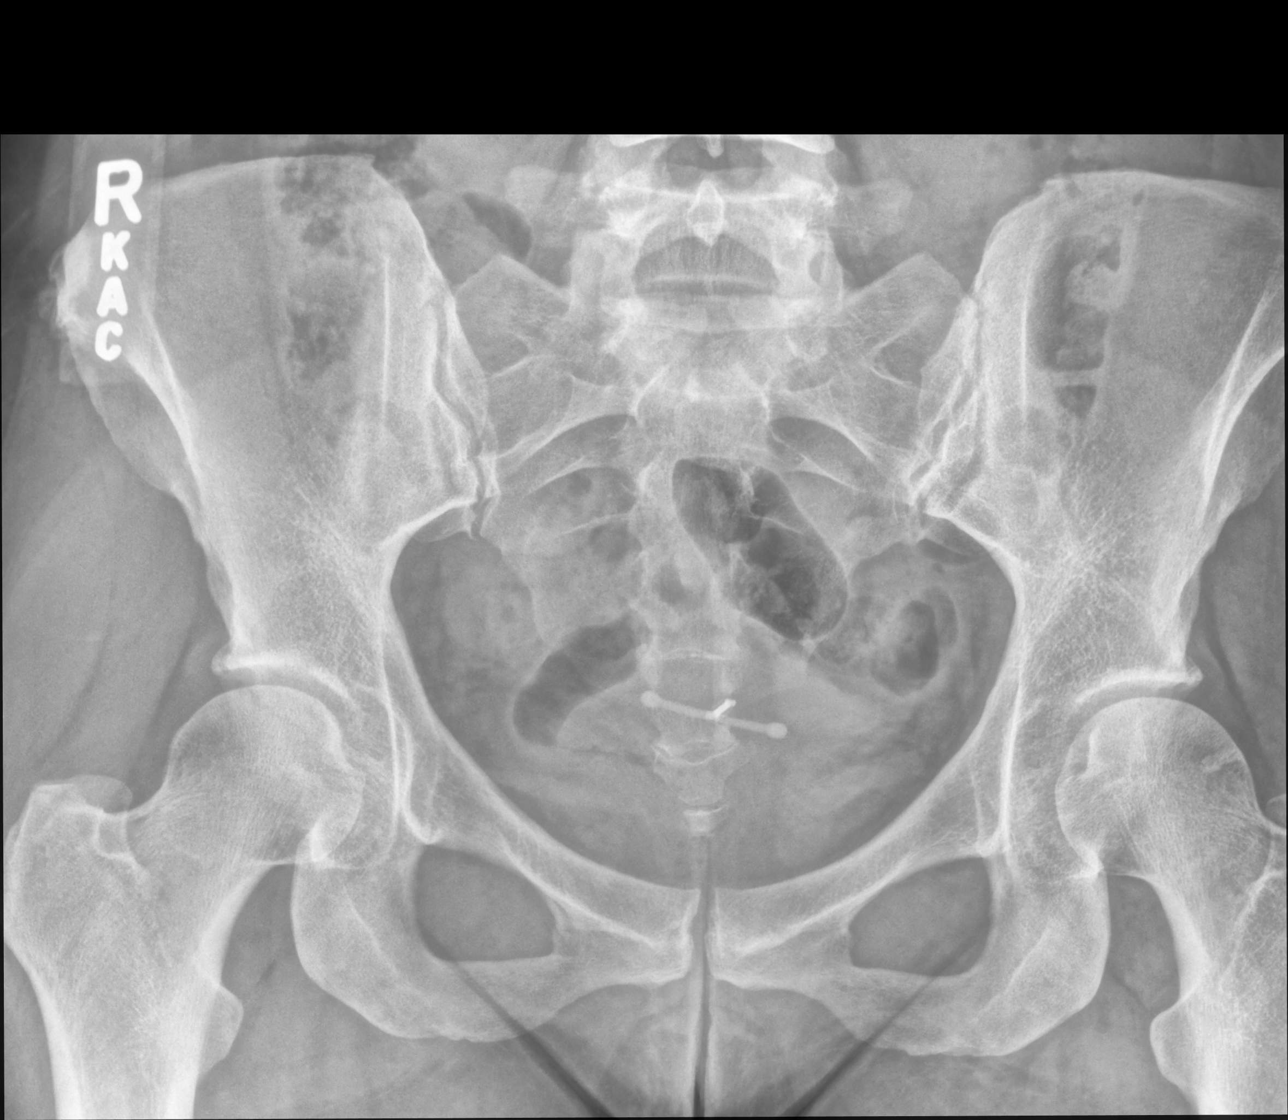

[abdomen standing ap]
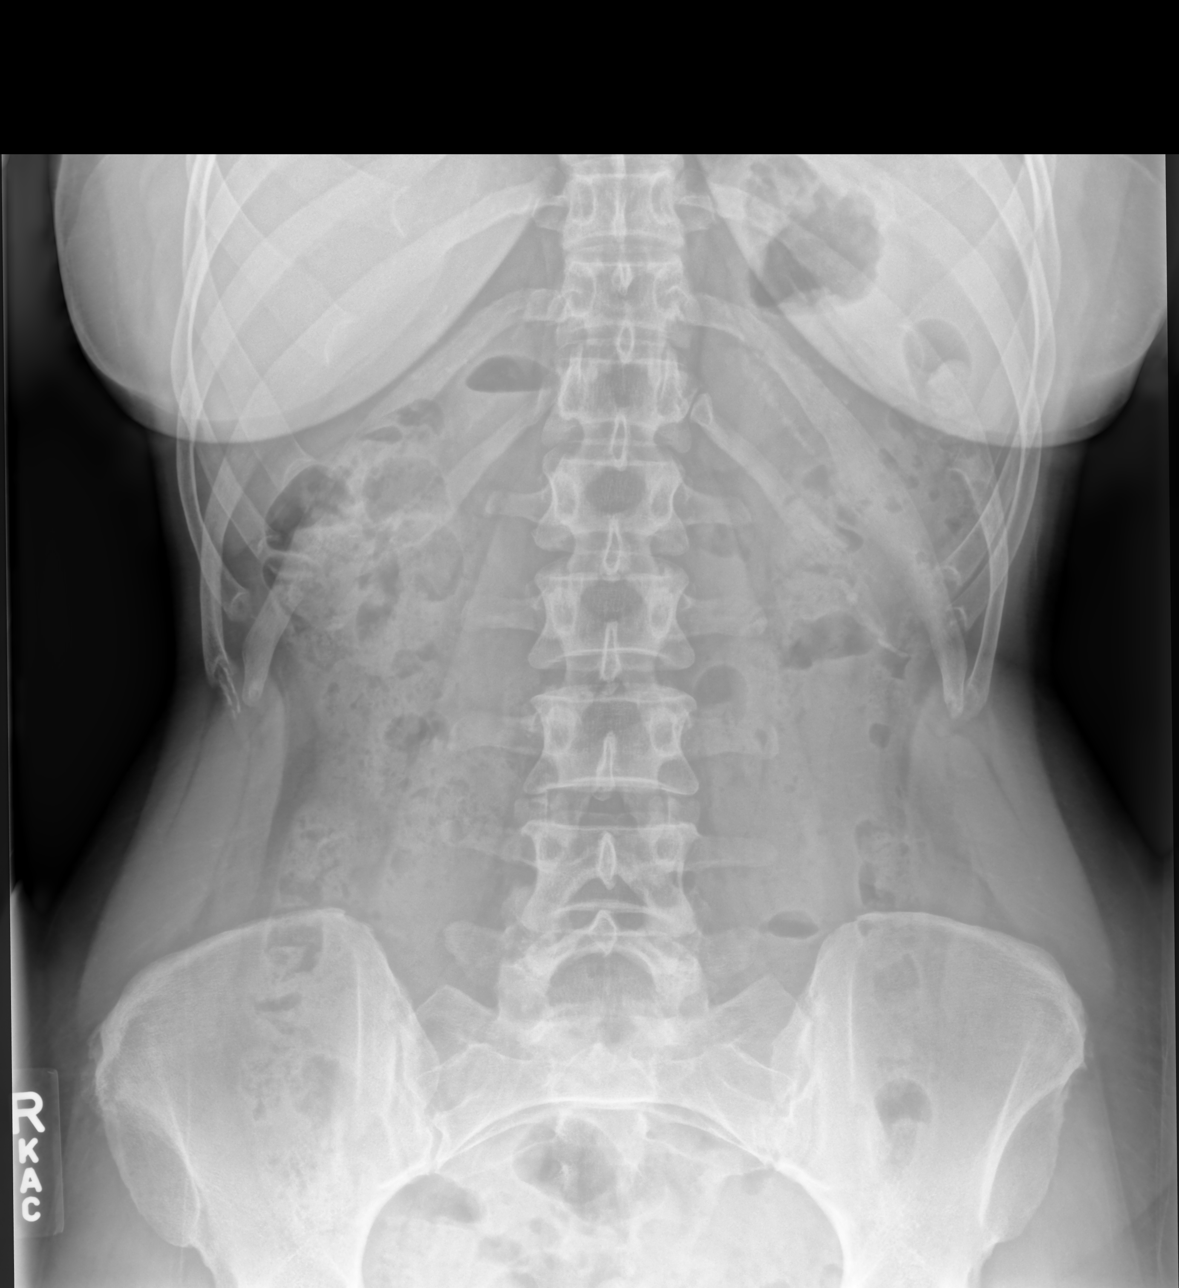

[3 of 3 positions shown; findings below may reference images not displayed]

FINDINGS: Stool scattered throughout the colon. Nonobstructed bowel-gas
pattern. Intrauterine device. Lung bases are clear. Osseous
structures unremarkable.
IMPRESSION: Nonobstructed bowel gas pattern.

## 2023-12-10 ENCOUNTER — Ambulatory Visit: Payer: Managed Care, Other (non HMO) | Admitting: Dermatology

## 2023-12-10 DIAGNOSIS — L814 Other melanin hyperpigmentation: Secondary | ICD-10-CM

## 2023-12-10 DIAGNOSIS — L578 Other skin changes due to chronic exposure to nonionizing radiation: Secondary | ICD-10-CM | POA: Diagnosis not present

## 2023-12-10 DIAGNOSIS — L821 Other seborrheic keratosis: Secondary | ICD-10-CM

## 2023-12-10 DIAGNOSIS — D229 Melanocytic nevi, unspecified: Secondary | ICD-10-CM

## 2023-12-10 DIAGNOSIS — Z1283 Encounter for screening for malignant neoplasm of skin: Secondary | ICD-10-CM

## 2023-12-10 DIAGNOSIS — L564 Polymorphous light eruption: Secondary | ICD-10-CM

## 2023-12-10 DIAGNOSIS — Z85820 Personal history of malignant melanoma of skin: Secondary | ICD-10-CM

## 2023-12-10 DIAGNOSIS — D239 Other benign neoplasm of skin, unspecified: Secondary | ICD-10-CM

## 2023-12-10 DIAGNOSIS — Z86018 Personal history of other benign neoplasm: Secondary | ICD-10-CM

## 2023-12-10 DIAGNOSIS — D1801 Hemangioma of skin and subcutaneous tissue: Secondary | ICD-10-CM

## 2023-12-10 NOTE — Progress Notes (Signed)
 Follow-Up Visit   Subjective  Caitlin Ward is a 43 y.o. female who presents for the following: Skin Cancer Screening and Full Body Skin Exam  The patient presents for Total-Body Skin Exam (TBSE) for skin cancer screening and mole check. The patient has spots, moles and lesions to be evaluated, some may be new or changing.    The following portions of the chart were reviewed this encounter and updated as appropriate: medications, allergies, medical history  Review of Systems:  No other skin or systemic complaints except as noted in HPI or Assessment and Plan.  Objective  Well appearing patient in no apparent distress; mood and affect are within normal limits.  A full examination was performed including scalp, head, eyes, ears, nose, lips, neck, chest, axillae, abdomen, back, buttocks, bilateral upper extremities, bilateral lower extremities, hands, feet, fingers, toes, fingernails, and toenails. All findings within normal limits unless otherwise noted below.   Relevant physical exam findings are noted in the Assessment and Plan.    Assessment & Plan   SKIN CANCER SCREENING PERFORMED TODAY.  ACTINIC DAMAGE - Chronic condition, secondary to cumulative UV/sun exposure - diffuse scaly erythematous macules with underlying dyspigmentation - Recommend daily broad spectrum sunscreen SPF 30+ to sun-exposed areas, reapply every 2 hours as needed.  - Staying in the shade or wearing long sleeves, sun glasses (UVA+UVB protection) and wide brim hats (4-inch brim around the entire circumference of the hat) are also recommended for sun protection.  - Call for new or changing lesions.  LENTIGINES, SEBORRHEIC KERATOSES, HEMANGIOMAS - Benign normal skin lesions - Benign-appearing - Call for any changes  MELANOCYTIC NEVI - Tan-brown and/or pink-flesh-colored symmetric macules and papules - 4 x 3 mm medium brown macule at right anterior thigh, darker inferior, photo compared 11/29/2021 - 2  mm medium dark brown macule at left spinal lower back - 3 mm brown macule, lighter papule center at left lateral thigh, photo compated 11/29/2021 - 4 mm light tan papule c/w collision between nevus and SK on dermoscopy at right mid back  - Benign appearing on exam today - Observation - Call clinic for new or changing moles - Recommend daily use of broad spectrum spf 30+ sunscreen to sun-exposed areas.   DERMATOFIBROMA Left upper calf below knee Exam: 4.0 mm light brown firm papule/nodule with dimpling  Treatment Plan: A dermatofibroma is a benign growth possibly related to trauma, such as an insect bite, cut from shaving, or inflamed acne-type bump.  Treatment options to remove include shave or excision with resulting scar and risk of recurrence.  Since benign-appearing and not bothersome, will observe for now.    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  Stable. Observation.  Call clinic for new or changing moles.  Recommend daily use of broad spectrum spf 30+ sunscreen to sun-exposed areas.       POLYMORPHIC LIGHT ERUPTION trunk, extremities   Exam: Clear today, Usually only flared at chest in spring with first sun exposure   Chronic condition that recurs. Discussed rash tends to be more frequent in spring and may lessen over summer as skin 'hardens" to UV exposure Recommend daily broad spectrum (UVA/B) sunscreen with zinc and photoprotection with clothing when possible   When flared, start mometasone  cream 1-2 times daily as needed for rash. Avoid applying to face, groin, and axilla.  Use as directed. Long-term use can cause thinning of the skin. Patient has at home.    Topical steroids (such as triamcinolone, fluocinolone, fluocinonide, mometasone , clobetasol, halobetasol, betamethasone, hydrocortisone) can cause  thinning and lightening of the skin if they are used for too long in the same area. Your physician has selected the right strength medicine for your problem and area affected on the body. Please use your medication only as directed by your physician to prevent side effects.  HISTORY OF DYSPLASTIC NEVUS No evidence of recurrence today at right scalpula 2009 Recommend regular full body skin exams Recommend daily broad spectrum sunscreen SPF 30+ to sun-exposed areas, reapply every 2 hours as needed.  Call if any new or changing lesions are noted between office visits   HISTORY OF MELANOMA - No evidence of recurrence today left arm, WLE 2009, clark S level IV, Breslow's 1.1 mm - Recommend regular full body skin exams - Recommend daily broad spectrum sunscreen SPF 30+ to sun-exposed areas, reapply every 2 hours as needed.  - Call if any new or changing lesions are noted between office visits     Return in about 1 year (around 12/09/2024) for TBSE, Hx melanoma, Hx Dysplastic Nevus.  IBernardine Bridegroom, CMA, am acting as scribe for Artemio Larry, MD .   Documentation: I have reviewed the above documentation for accuracy and completeness, and I agree with the above.  Artemio Larry, MD

## 2023-12-10 NOTE — Patient Instructions (Signed)

## 2024-02-06 LAB — LAB REPORT - SCANNED: A1c: 5.6

## 2024-03-17 ENCOUNTER — Encounter: Payer: Self-pay | Admitting: Family Medicine

## 2024-03-17 NOTE — Telephone Encounter (Signed)
 Labs printed and will be placed with [facility] for provider tomorrow   FYI

## 2024-03-18 ENCOUNTER — Encounter: Payer: Self-pay | Admitting: Family Medicine

## 2024-03-18 ENCOUNTER — Ambulatory Visit: Admitting: Family Medicine

## 2024-03-18 VITALS — BP 132/84 | HR 72 | Temp 98.5°F | Ht 64.5 in | Wt 150.4 lb

## 2024-03-18 DIAGNOSIS — R42 Dizziness and giddiness: Secondary | ICD-10-CM | POA: Insufficient documentation

## 2024-03-18 MED ORDER — MECLIZINE HCL 25 MG PO TABS: 25.0000 mg | ORAL_TABLET | Freq: Three times a day (TID) | ORAL | 0 refills | Status: DC | PRN

## 2024-03-18 NOTE — Patient Instructions (Addendum)
 Do not change position quickly  Take your time -especially in am   Take a look at the handouts You can try the epley maneuver   Hold on to meclizine  to have if symptoms worsen Stay hydrated    I put the referral in for ENT Please let us  know if you don't hear in 1-2 weeks to set that up (mychart message or call or letter)

## 2024-03-18 NOTE — Assessment & Plan Note (Addendum)
 2 weeks of positional dizziness- worse in am and after periods of inactivity, causing feeling of spinning and occational nausea (no vomiting) No addnl neurologic or respiratory symptoms and no head trauma   Normal exam today except for 2-3 beats of left horizontal nystagmus Dizziness is triggered by position change   Suspect peripheral vertigo  Given handouts on condition and also epley maneuver to try in safe fashion Prescription meclizine  to keep on hand in case of worsening symptoms  Given length of symptoms-did refer to ENT for further eval  Instructed to call if symptoms worsen or any neuro changes  Call back and Er precautions noted in detail today    I personally spent a total of 26 minutes in the care of the patient today including preparing to see the patient, getting/reviewing separately obtained history, performing a medically appropriate exam/evaluation, counseling and educating, placing orders, referring and communicating with other health care professionals, and documenting clinical information in the EHR.

## 2024-03-18 NOTE — Progress Notes (Addendum)
 Subjective:    Patient ID: Caitlin Ward, female    DOB: 10-31-1980, 43 y.o.   MRN: [id number]  HPI  Wt Readings from Last 3 Encounters:  03/18/24 150 lb 6 oz (68.2 kg)  08/17/23 148 lb 8 oz (67.4 kg)  11/27/22 149 lb (67.6 kg)   25.41 kg/m  Vitals:   03/18/24 1457  BP: 132/84  Pulse: 72  Temp: 98.5 F (36.9 C)  SpO2: 100%   Pt presents with c/o dizziness and nausea    Almost 2 weeks  (no trigger or illness) and less stress  Dizziness  Worse in ams- less as the day goes on  Comes and goes  Triggered by movement and change in position  Occational nausea   Spinning feeling  No vomiting   No chance pregnant  Has IUD  Spotting this am   No nasal congestion  No ear symptoms  No headache   Has not done any maneuvers   Was going to the gym Well hydrated       Patient Active Problem List   Diagnosis Date Noted   Vertigo 03/18/2024   History of melanoma 08/17/2023   IUD (intrauterine device) in place 10/05/2022   Encounter for screening mammogram for breast cancer 08/02/2022   Routine general medical examination at a health care facility 06/15/2021   Abnormal MRI, cervical spine 02/05/2015   Small fiber neuropathy 11/18/2014   Temperature intolerance 09/28/2014   Hyperlipidemia 09/19/2013   Myofascial pain 05/28/2012   Hematuria, microscopic 10/23/2011   SLEEP DISORDER 04/27/2010   DYSMENORRHEA 02/27/2007   Past Medical History:  Diagnosis Date   Anxiety    Dysplastic nevus 06/15/2008   R scapula mod - severe, excision    Malignant melanoma (HCC) 2009   left arm- wide excision, clarks level 4, breslow's 1.55mm   Skin cancer    Past Surgical History:  Procedure Laterality Date   BREAST ENHANCEMENT SURGERY     COSMETIC SURGERY  2012   Breast augmentation   Social History   Tobacco Use   Smoking status: Never   Smokeless tobacco: Never  Vaping Use   Vaping status: Never Used  Substance Use Topics   Alcohol use: Yes    Alcohol/week:  0.0 standard drinks of alcohol    Comment: Occ   Drug use: No   Family History  Problem Relation Age of Onset   Nephrolithiasis Mother    Coronary artery disease Maternal Grandfather    COPD Maternal Grandfather    Hypertension Paternal Grandfather    Kidney disease Paternal Grandfather    Allergies  Allergen Reactions   Penicillins     REACTION: As a small child does not remember exact reaction.   Current Outpatient Medications on File Prior to Visit  Medication Sig Dispense Refill   busPIRone  (BUSPAR ) 15 MG tablet Take 1 tablet (15 mg total) by mouth 2 (two) times daily as needed. Anxiety 180 tablet 3   levonorgestrel (MIRENA) 20 MCG/DAY IUD 1 each by Intrauterine route once.     mometasone  (ELOCON ) 0.1 % cream Apply 1-2 times daily as needed for rash. Avoid applying to face, groin, and axilla. Use as directed. Long-term use can cause thinning of the skin. 45 g 1   rosuvastatin  (CRESTOR ) 5 MG tablet TAKE 1 TABLET BY MOUTH EVERY DAY 90 tablet 3   No current facility-administered medications on file prior to visit.    Review of Systems  Constitutional:  Negative for activity change, appetite change,  fatigue, fever and unexpected weight change.  HENT:  Negative for congestion, ear pain, rhinorrhea, sinus pressure and sore throat.   Eyes:  Negative for pain, redness and visual disturbance.  Respiratory:  Negative for cough, shortness of breath and wheezing.   Cardiovascular:  Negative for chest pain and palpitations.  Gastrointestinal:  Positive for nausea. Negative for abdominal pain, blood in stool, constipation, diarrhea and vomiting.  Endocrine: Negative for polydipsia and polyuria.  Genitourinary:  Negative for dysuria, frequency and urgency.  Musculoskeletal:  Negative for arthralgias, back pain and myalgias.  Skin:  Negative for pallor and rash.  Allergic/Immunologic: Negative for environmental allergies.  Neurological:  Positive for dizziness. Negative for tremors,  seizures, syncope, facial asymmetry, speech difficulty, weakness, numbness and headaches.  Hematological:  Negative for adenopathy. Does not bruise/bleed easily.  Psychiatric/Behavioral:  Negative for decreased concentration and dysphoric mood. The patient is not nervous/anxious.        Objective:   Physical Exam Constitutional:      General: She is not in acute distress.    Appearance: Normal appearance. She is well-developed and normal weight. She is not ill-appearing or diaphoretic.  HENT:     Head: Normocephalic and atraumatic.     Right Ear: Tympanic membrane, ear canal and external ear normal.     Left Ear: Tympanic membrane, ear canal and external ear normal.     Nose: Nose normal. No congestion or rhinorrhea.     Mouth/Throat:     Mouth: Mucous membranes are moist.     Pharynx: No oropharyngeal exudate.  Eyes:     General: No scleral icterus.       Right eye: No discharge.        Left eye: No discharge.     Conjunctiva/sclera: Conjunctivae normal.     Pupils: Pupils are equal, round, and reactive to light.     Comments: 2-3 beats of nystagmus with left sided gaze  This also reproduces dizziness   Neck:     Thyroid: No thyromegaly.     Vascular: No carotid bruit or JVD.     Trachea: No tracheal deviation.  Cardiovascular:     Rate and Rhythm: Normal rate and regular rhythm.     Heart sounds: Normal heart sounds. No murmur heard. Pulmonary:     Effort: Pulmonary effort is normal. No respiratory distress.     Breath sounds: Normal breath sounds. No wheezing or rales.  Abdominal:     General: Bowel sounds are normal. There is no distension.     Palpations: Abdomen is soft. There is no mass.     Tenderness: There is no abdominal tenderness.  Musculoskeletal:        General: No tenderness.     Cervical back: Full passive range of motion without pain, normal range of motion and neck supple. No tenderness.  Lymphadenopathy:     Cervical: No cervical adenopathy.  Skin:     General: Skin is warm and dry.     Coloration: Skin is not pale.     Findings: No rash.  Neurological:     Mental Status: She is alert and oriented to person, place, and time.     Cranial Nerves: No cranial nerve deficit, dysarthria or facial asymmetry.     Sensory: Sensation is intact. No sensory deficit.     Motor: No weakness, tremor, atrophy, abnormal muscle tone, seizure activity or pronator drift.     Coordination: Coordination is intact. Romberg sign negative. Coordination normal. Finger-Nose-Finger  Test normal.     Gait: Gait normal.     Deep Tendon Reflexes: Reflexes are normal and symmetric. Reflexes normal.     Comments: No focal cerebellar signs   Psychiatric:        Behavior: Behavior normal.        Thought Content: Thought content normal.           Assessment & Plan:   Problem List Items Addressed This Visit       Other   Vertigo - Primary   2 weeks of positional dizziness- worse in am and after periods of inactivity, causing feeling of spinning and occational nausea (no vomiting) No addnl neurologic or respiratory symptoms and no head trauma   Normal exam today except for 2-3 beats of left horizontal nystagmus Dizziness is triggered by position change   Suspect peripheral vertigo  Given handouts on condition and also epley maneuver to try in safe fashion Prescription meclizine  to keep on hand in case of worsening symptoms  Given length of symptoms-did refer to ENT for further eval  Instructed to call if symptoms worsen or any neuro changes  Call back and Er precautions noted in detail today    I personally spent a total of 26 minutes in the care of the patient today including preparing to see the patient, getting/reviewing separately obtained history, performing a medically appropriate exam/evaluation, counseling and educating, placing orders, referring and communicating with other health care professionals, and documenting clinical information in the  EHR.       Relevant Orders   Ambulatory referral to ENT

## 2024-03-28 ENCOUNTER — Encounter: Payer: Self-pay | Admitting: Family Medicine

## 2024-03-28 NOTE — Telephone Encounter (Signed)
 Will route to referral dpt to f/u on ENT referral

## 2024-04-16 ENCOUNTER — Encounter: Payer: Self-pay | Admitting: *Deleted

## 2024-05-23 NOTE — Telephone Encounter (Signed)
 Referral faxed to [facility] on 04/16/24.  See referral notes for follow up  Nothing further needed.

## 2024-07-14 ENCOUNTER — Telehealth: Payer: Self-pay

## 2024-07-14 NOTE — Telephone Encounter (Signed)
 Please schedule fasting lab appt prior to CPE appt., PCP will place orders closer to appt date

## 2024-07-14 NOTE — Telephone Encounter (Signed)
 Called and schedule pt for  labs

## 2024-07-14 NOTE — Telephone Encounter (Signed)
 Copied from CRM #[id number]. Topic: Clinical - Request for Lab/Test Order >> Jul 14, 2024 11:17 AM Laymon HERO wrote: Reason for CRM: patient wanting to get labs done before physical appt on 1/20

## 2024-08-10 ENCOUNTER — Telehealth: Payer: Self-pay | Admitting: Family Medicine

## 2024-08-10 DIAGNOSIS — Z0000 Encounter for general adult medical examination without abnormal findings: Secondary | ICD-10-CM

## 2024-08-10 DIAGNOSIS — E7800 Pure hypercholesterolemia, unspecified: Secondary | ICD-10-CM

## 2024-08-10 NOTE — Telephone Encounter (Signed)
-----   Message from Caitlin Ward sent at 08/01/2024 11:45 AM EST ----- Regarding: Lab orders for Tue. 1.13.26 Patient is scheduled for CPX labs, please order future labs, Thanks , Caitlin

## 2024-08-12 ENCOUNTER — Other Ambulatory Visit

## 2024-08-12 DIAGNOSIS — E7800 Pure hypercholesterolemia, unspecified: Secondary | ICD-10-CM

## 2024-08-12 DIAGNOSIS — Z0000 Encounter for general adult medical examination without abnormal findings: Secondary | ICD-10-CM

## 2024-08-13 ENCOUNTER — Ambulatory Visit: Payer: Self-pay | Admitting: Family Medicine

## 2024-08-13 LAB — COMPREHENSIVE METABOLIC PANEL WITH GFR
ALT: 19 IU/L (ref 0–32)
AST: 19 IU/L (ref 0–40)
Albumin: 4.4 g/dL (ref 3.9–4.9)
Alkaline Phosphatase: 47 IU/L (ref 41–116)
BUN/Creatinine Ratio: 20 (ref 9–23)
BUN: 15 mg/dL (ref 6–24)
Bilirubin Total: 0.3 mg/dL (ref 0.0–1.2)
CO2: 20 mmol/L (ref 20–29)
Calcium: 9.5 mg/dL (ref 8.7–10.2)
Chloride: 105 mmol/L (ref 96–106)
Creatinine, Ser: 0.76 mg/dL (ref 0.57–1.00)
Globulin, Total: 2.3 g/dL (ref 1.5–4.5)
Glucose: 100 mg/dL — ABNORMAL HIGH (ref 70–99)
Potassium: 5.8 mmol/L — ABNORMAL HIGH (ref 3.5–5.2)
Sodium: 139 mmol/L (ref 134–144)
Total Protein: 6.7 g/dL (ref 6.0–8.5)
eGFR: 100 mL/min/1.73

## 2024-08-13 LAB — LIPID PANEL
Chol/HDL Ratio: 4.2 ratio (ref 0.0–4.4)
Cholesterol, Total: 185 mg/dL (ref 100–199)
HDL: 44 mg/dL
LDL Chol Calc (NIH): 110 mg/dL — ABNORMAL HIGH (ref 0–99)
Triglycerides: 175 mg/dL — ABNORMAL HIGH (ref 0–149)
VLDL Cholesterol Cal: 31 mg/dL (ref 5–40)

## 2024-08-13 LAB — CBC WITH DIFFERENTIAL/PLATELET
Basophils Absolute: 0 x10E3/uL (ref 0.0–0.2)
Basos: 0 %
EOS (ABSOLUTE): 0.3 x10E3/uL (ref 0.0–0.4)
Eos: 5 %
Hematocrit: 42 % (ref 34.0–46.6)
Hemoglobin: 13.7 g/dL (ref 11.1–15.9)
Immature Grans (Abs): 0 x10E3/uL (ref 0.0–0.1)
Immature Granulocytes: 0 %
Lymphocytes Absolute: 2.2 x10E3/uL (ref 0.7–3.1)
Lymphs: 31 %
MCH: 30.9 pg (ref 26.6–33.0)
MCHC: 32.6 g/dL (ref 31.5–35.7)
MCV: 95 fL (ref 79–97)
Monocytes Absolute: 0.6 x10E3/uL (ref 0.1–0.9)
Monocytes: 8 %
Neutrophils Absolute: 3.9 x10E3/uL (ref 1.4–7.0)
Neutrophils: 56 %
Platelets: 333 x10E3/uL (ref 150–450)
RBC: 4.43 x10E6/uL (ref 3.77–5.28)
RDW: 12.6 % (ref 11.7–15.4)
WBC: 6.9 x10E3/uL (ref 3.4–10.8)

## 2024-08-13 LAB — TSH: TSH: 2 u[IU]/mL (ref 0.450–4.500)

## 2024-08-19 ENCOUNTER — Encounter: Payer: Self-pay | Admitting: Family Medicine

## 2024-08-19 ENCOUNTER — Ambulatory Visit (INDEPENDENT_AMBULATORY_CARE_PROVIDER_SITE_OTHER): Admitting: Family Medicine

## 2024-08-19 VITALS — BP 112/66 | HR 60 | Temp 98.2°F | Ht 64.75 in | Wt 153.0 lb

## 2024-08-19 DIAGNOSIS — E875 Hyperkalemia: Secondary | ICD-10-CM | POA: Diagnosis not present

## 2024-08-19 DIAGNOSIS — Z85820 Personal history of malignant melanoma of skin: Secondary | ICD-10-CM

## 2024-08-19 DIAGNOSIS — M545 Low back pain, unspecified: Secondary | ICD-10-CM | POA: Diagnosis not present

## 2024-08-19 DIAGNOSIS — E7800 Pure hypercholesterolemia, unspecified: Secondary | ICD-10-CM | POA: Diagnosis not present

## 2024-08-19 DIAGNOSIS — Z0000 Encounter for general adult medical examination without abnormal findings: Secondary | ICD-10-CM | POA: Diagnosis not present

## 2024-08-19 DIAGNOSIS — G8929 Other chronic pain: Secondary | ICD-10-CM | POA: Diagnosis not present

## 2024-08-19 MED ORDER — ROSUVASTATIN CALCIUM 5 MG PO TABS: 5.0000 mg | ORAL_TABLET | Freq: Every day | ORAL | 3 refills | Status: AC

## 2024-08-19 NOTE — Assessment & Plan Note (Signed)
 Disc goals for lipids and reasons to control them Rev last labs with pt Rev low sat fat diet in detail  Stable  Continues crestor  5 mg daily  Diet not optimal/getting back on track

## 2024-08-19 NOTE — Assessment & Plan Note (Signed)
 Re check bmet today  Was taking fair amt of ibuprofen

## 2024-08-19 NOTE — Assessment & Plan Note (Signed)
 Reviewed health habits including diet and exercise and skin cancer prevention Reviewed appropriate screening tests for age  Also reviewed health mt list, fam hx and immunization status , as well as social and family history   See HPI Labs reviewed and ordered Health Maintenance  Topic Date Due   Hepatitis C Screening  Never done   Hepatitis B Vaccine (1 of 3 - 19+ 3-dose series) Never done   Breast Cancer Screening  10/16/2024   COVID-19 Vaccine (1) 09/01/2024*   Flu Shot  10/28/2024*   Pap with HPV screening  11/27/2027   DTaP/Tdap/Td vaccine (4 - Td or Tdap) 07/30/2031   HPV Vaccine (No Doses Required) Completed   HIV Screening  Completed   Pneumococcal Vaccine  Aged Out   Meningitis B Vaccine  Aged Out  *Topic was postponed. The date shown is not the original due date.    Declines flu shot -already had Flu A-plans yearly in fall from now on  Mammogram due in march [facility]  No family history of colon cancer /will discuss screening at 23 Discussed fall prevention, supplements and exercise for bone density  Utd derm care with history of melanoma, using sun protection  PHQ 0

## 2024-08-19 NOTE — Patient Instructions (Signed)
 Let us  know if PT does not help your back   Keep up the strength training    Lab today for bmet (for potassium)    Continue current medicine

## 2024-08-19 NOTE — Assessment & Plan Note (Signed)
 Continues yearly derm visits Nothing new Using sun protectoin

## 2024-08-19 NOTE — Progress Notes (Signed)
 "  Subjective:    Patient ID: Caitlin Ward, female    DOB: 1981-01-30, 44 y.o.   MRN: [id number]  HPI  Here for health maintenance exam and to review chronic medical problems   Wt Readings from Last 3 Encounters:  08/19/24 153 lb (69.4 kg)  03/18/24 150 lb 6 oz (68.2 kg)  08/17/23 148 lb 8 oz (67.4 kg)   25.66 kg/m  Vitals:   08/19/24 0833  BP: 112/66  Pulse: 60  Temp: 98.2 F (36.8 C)  SpO2: 100%    Immunization History  Administered Date(s) Administered   Influenza Split 05/01/2011   Influenza, Seasonal, Injecte, Preservative Fre 08/17/2023   Influenza,inj,Quad PF,6+ Mos 06/11/2019, 06/15/2020, 07/29/2021   Td 08/01/1999, 01/24/2010   Tdap 07/29/2021    Health Maintenance Due  Topic Date Due   Hepatitis C Screening  Never done   Hepatitis B Vaccines 19-59 Average Risk (1 of 3 - 19+ 3-dose series) Never done   Mammogram  10/16/2024   Feeling ok  Some back issues since early dec  Goes to chiropractor (anterior pelvic tilt)  Has PT appointment on Friday  Is working on core strength  Uses TENS unit  Ibuprofen   Flu shot - declines/thinks too late  Had the flu in December - A    Mammogram 09/2023 Self breast exam- no lumps   Gyn health Pap 10/2022 Has mirena IUD- rarely has period     Colon cancer screening -no fam history   Bone health   Falls-none  Fractures-none  Supplements - vitamin D3 and mag  Last vitamin D  Lab Results  Component Value Date   VD25OH 32.4 05/28/2012    Exercise  Decreased recently due to back  Some strength training /core work primarily    History of melanoma PH Goes yearly / nothing new  Uses sun protection    Mood    08/19/2024    8:37 AM 03/18/2024    3:06 PM 08/17/2023    3:24 PM 08/02/2022    9:32 AM 07/29/2021   11:28 AM  Depression screen PHQ 2/9  Decreased Interest 0 0 0 0 0  Down, Depressed, Hopeless 0 0 0 0 0  PHQ - 2 Score 0 0 0 0 0  Altered sleeping 0 0 0 0 0  Tired, decreased energy 0 0 0 0  0  Change in appetite 0 0 0 0 0  Feeling bad or failure about yourself  0 0 0 0 0  Trouble concentrating 0 0 0 0 0  Moving slowly or fidgety/restless 0 0 0 0 0  Suicidal thoughts 0 0 0 0 0  PHQ-9 Score 0 0  0  0  0   Difficult doing work/chores Not difficult at all Not difficult at all Not difficult at all Not difficult at all Not difficult at all     Data saved with a previous flowsheet row definition    Hyperlipidemia Lab Results  Component Value Date   CHOL 185 08/12/2024   CHOL 178 08/10/2023   CHOL 157 07/28/2022   Lab Results  Component Value Date   HDL 44 08/12/2024   HDL 47 08/10/2023   HDL 45 07/28/2022   Lab Results  Component Value Date   LDLCALC 110 (H) 08/12/2024   LDLCALC 110 (H) 08/10/2023   LDLCALC 94 07/28/2022   Lab Results  Component Value Date   TRIG 175 (H) 08/12/2024   TRIG 115 08/10/2023   TRIG 100 07/28/2022  Lab Results  Component Value Date   CHOLHDL 4.2 08/12/2024   CHOLHDL 3.8 08/10/2023   CHOLHDL 3.5 07/28/2022   No results found for: LDLDIRECT Crestor  5 mg daily  Diet has been fair -less good around the holidays  Focus has been on more protein  Uses myfitness pal to track  Avoiding fried food No beef    Lab Results  Component Value Date   ALT 19 08/12/2024   AST 19 08/12/2024   ALKPHOS 47 08/12/2024   BILITOT 0.3 08/12/2024     Lab Results  Component Value Date   NA 139 08/12/2024   K 5.8 (H) 08/12/2024   CO2 20 08/12/2024   GLUCOSE 100 (H) 08/12/2024   BUN 15 08/12/2024   CREATININE 0.76 08/12/2024   CALCIUM  9.5 08/12/2024   GFR 111.18 07/22/2012   EGFR 100 08/12/2024   GFRNONAA 100 06/11/2020   Has been taking ibuprofen for back -daily Backed off for a bit      Lab Results  Component Value Date   WBC 6.9 08/12/2024   HGB 13.7 08/12/2024   HCT 42.0 08/12/2024   MCV 95 08/12/2024   PLT 333 08/12/2024   Lab Results  Component Value Date   TSH 2.000 08/12/2024     Patient Active Problem List    Diagnosis Date Noted   Low back pain 08/19/2024   Hyperkalemia 08/19/2024   History of melanoma 08/17/2023   IUD (intrauterine device) in place 10/05/2022   Encounter for screening mammogram for breast cancer 08/02/2022   Routine general medical examination at a health care facility 06/15/2021   Abnormal MRI, cervical spine 02/05/2015   Small fiber neuropathy 11/18/2014   Hyperlipidemia 09/19/2013   Myofascial pain 05/28/2012   SLEEP DISORDER 04/27/2010   DYSMENORRHEA 02/27/2007   Past Medical History:  Diagnosis Date   Anxiety    Dysplastic nevus 06/15/2008   R scapula mod - severe, excision    Malignant melanoma (HCC) 2009   left arm- wide excision, clarks level 4, breslow's 1.29mm   Skin cancer    Past Surgical History:  Procedure Laterality Date   BREAST ENHANCEMENT SURGERY     COSMETIC SURGERY  2012   Breast augmentation   Social History[1] Family History  Problem Relation Age of Onset   Nephrolithiasis Mother    Coronary artery disease Maternal Grandfather    COPD Maternal Grandfather    Hypertension Paternal Grandfather    Kidney disease Paternal Grandfather    Allergies[2] Medications Ordered Prior to Encounter[3]  Review of Systems  Constitutional:  Positive for fatigue. Negative for activity change, appetite change, fever and unexpected weight change.  HENT:  Negative for congestion, ear pain, rhinorrhea, sinus pressure and sore throat.   Eyes:  Negative for pain, redness and visual disturbance.  Respiratory:  Negative for cough, shortness of breath and wheezing.   Cardiovascular:  Negative for chest pain and palpitations.  Gastrointestinal:  Negative for abdominal pain, blood in stool, constipation and diarrhea.  Endocrine: Negative for polydipsia and polyuria.  Genitourinary:  Negative for dysuria, frequency and urgency.  Musculoskeletal:  Positive for back pain. Negative for arthralgias and myalgias.  Skin:  Negative for pallor and rash.   Allergic/Immunologic: Negative for environmental allergies.  Neurological:  Negative for dizziness, syncope and headaches.  Hematological:  Negative for adenopathy. Does not bruise/bleed easily.  Psychiatric/Behavioral:  Negative for decreased concentration and dysphoric mood. The patient is not nervous/anxious.        Objective:   Physical  Exam Constitutional:      General: She is not in acute distress.    Appearance: Normal appearance. She is well-developed and normal weight. She is not ill-appearing or diaphoretic.  HENT:     Head: Normocephalic and atraumatic.     Right Ear: Tympanic membrane, ear canal and external ear normal.     Left Ear: Tympanic membrane, ear canal and external ear normal.     Nose: Nose normal. No congestion.     Mouth/Throat:     Mouth: Mucous membranes are moist.     Pharynx: Oropharynx is clear. No posterior oropharyngeal erythema.  Eyes:     General: No scleral icterus.    Extraocular Movements: Extraocular movements intact.     Conjunctiva/sclera: Conjunctivae normal.     Pupils: Pupils are equal, round, and reactive to light.  Neck:     Thyroid: No thyromegaly.     Vascular: No carotid bruit or JVD.  Cardiovascular:     Rate and Rhythm: Normal rate and regular rhythm.     Pulses: Normal pulses.     Heart sounds: Normal heart sounds.     No gallop.  Pulmonary:     Effort: Pulmonary effort is normal. No respiratory distress.     Breath sounds: Normal breath sounds. No wheezing.     Comments: Good air exch Chest:     Chest wall: No tenderness.  Abdominal:     General: Bowel sounds are normal. There is no distension or abdominal bruit.     Palpations: Abdomen is soft. There is no mass.     Tenderness: There is no abdominal tenderness.     Hernia: No hernia is present.  Genitourinary:    Comments: Breast exam: No mass, nodules, thickening, tenderness, bulging, retraction, inflamation, nipple discharge or skin changes noted.  No axillary or  clavicular LA.     Implants noted  Musculoskeletal:        General: No tenderness. Normal range of motion.     Cervical back: Normal range of motion and neck supple. No rigidity. No muscular tenderness.     Right lower leg: No edema.     Left lower leg: No edema.     Comments: No kyphosis   Lymphadenopathy:     Cervical: No cervical adenopathy.  Skin:    General: Skin is warm and dry.     Coloration: Skin is not pale.     Findings: No erythema or rash.     Comments: Solar lentigines diffusely   Neurological:     Mental Status: She is alert. Mental status is at baseline.     Cranial Nerves: No cranial nerve deficit.     Motor: No abnormal muscle tone.     Coordination: Coordination normal.     Gait: Gait normal.     Deep Tendon Reflexes: Reflexes are normal and symmetric. Reflexes normal.  Psychiatric:        Mood and Affect: Mood normal.        Cognition and Memory: Cognition and memory normal.           Assessment & Plan:   Problem List Items Addressed This Visit       Other   Routine general medical examination at a health care facility - Primary   Reviewed health habits including diet and exercise and skin cancer prevention Reviewed appropriate screening tests for age  Also reviewed health mt list, fam hx and immunization status , as well as social and  family history   See HPI Labs reviewed and ordered Health Maintenance  Topic Date Due   Hepatitis C Screening  Never done   Hepatitis B Vaccine (1 of 3 - 19+ 3-dose series) Never done   Breast Cancer Screening  10/16/2024   COVID-19 Vaccine (1) 09/01/2024*   Flu Shot  10/28/2024*   Pap with HPV screening  11/27/2027   DTaP/Tdap/Td vaccine (4 - Td or Tdap) 07/30/2031   HPV Vaccine (No Doses Required) Completed   HIV Screening  Completed   Pneumococcal Vaccine  Aged Out   Meningitis B Vaccine  Aged Out  *Topic was postponed. The date shown is not the original due date.    Declines flu shot -already had Flu  A-plans yearly in fall from now on  Mammogram due in march [facility] g[facility]  No family history of colon cancer /will discuss screening at 59 Discussed fall prevention, supplements and exercise for bone density  Utd derm care with history of melanoma, using sun protection  PHQ 0       Low back pain   Suspect muscular Seeing chiropractor and about to start PT   No neuro symptoms   Will update if no improvement       Hyperlipidemia   Disc goals for lipids and reasons to control them Rev last labs with pt Rev low sat fat diet in detail  Stable  Continues crestor  5 mg daily  Diet not optimal/getting back on track       Relevant Medications   rosuvastatin  (CRESTOR ) 5 MG tablet   Hyperkalemia   Re check bmet today  Was taking fair amt of ibuprofen       Relevant Orders   Basic metabolic panel with GFR   History of melanoma   Continues yearly derm visits Nothing new Using sun protectoin          [1]  Social History Tobacco Use   Smoking status: Never   Smokeless tobacco: Never  Vaping Use   Vaping status: Never Used  Substance Use Topics   Alcohol use: Yes    Alcohol/week: 4.0 standard drinks of alcohol    Types: 4 Glasses of wine per week    Comment: Occ   Drug use: Never  [2]  Allergies Allergen Reactions   Penicillins     REACTION: As a small child does not remember exact reaction.  [3]  Current Outpatient Medications on File Prior to Visit  Medication Sig Dispense Refill   busPIRone  (BUSPAR ) 15 MG tablet Take 1 tablet (15 mg total) by mouth 2 (two) times daily as needed. Anxiety 180 tablet 3   levonorgestrel (MIRENA) 20 MCG/DAY IUD 1 each by Intrauterine route once.     mometasone  (ELOCON ) 0.1 % cream Apply 1-2 times daily as needed for rash. Avoid applying to face, groin, and axilla. Use as directed. Long-term use can cause thinning of the skin. 45 g 1   No current facility-administered medications on file prior to visit.   "

## 2024-08-19 NOTE — Assessment & Plan Note (Signed)
 Suspect muscular Seeing chiropractor and about to start PT   No neuro symptoms   Will update if no improvement

## 2024-08-20 ENCOUNTER — Ambulatory Visit: Payer: Self-pay | Admitting: Family Medicine

## 2024-08-20 LAB — BASIC METABOLIC PANEL WITH GFR
BUN/Creatinine Ratio: 20 (ref 9–23)
BUN: 14 mg/dL (ref 6–24)
CO2: 23 mmol/L (ref 20–29)
Calcium: 9.3 mg/dL (ref 8.7–10.2)
Chloride: 102 mmol/L (ref 96–106)
Creatinine, Ser: 0.7 mg/dL (ref 0.57–1.00)
Glucose: 97 mg/dL (ref 70–99)
Potassium: 4.9 mmol/L (ref 3.5–5.2)
Sodium: 139 mmol/L (ref 134–144)
eGFR: 111 mL/min/1.73

## 2025-01-05 ENCOUNTER — Encounter: Admitting: Dermatology
